# Patient Record
Sex: Female | Born: 1980 | Race: White | Hispanic: No | Marital: Married | Smoking: Never smoker
Health system: Southern US, Community
[De-identification: ages and names within clinical notes are randomized; demographics above are authoritative.]

## PROBLEM LIST (undated history)

## (undated) DIAGNOSIS — E039 Hypothyroidism, unspecified: Secondary | ICD-10-CM

## (undated) DIAGNOSIS — T7840XA Allergy, unspecified, initial encounter: Secondary | ICD-10-CM

## (undated) HISTORY — PX: HERNIA REPAIR: SHX51

## (undated) HISTORY — DX: Allergy, unspecified, initial encounter: T78.40XA

## (undated) HISTORY — PX: DIAPHRAGMATIC HERNIA REPAIR: SUR1167

---

## 2000-02-22 ENCOUNTER — Emergency Department (HOSPITAL_COMMUNITY): Admission: EM | Admit: 2000-02-22 | Discharge: 2000-02-22 | Payer: Self-pay | Admitting: *Deleted

## 2004-03-06 ENCOUNTER — Other Ambulatory Visit: Admission: RE | Admit: 2004-03-06 | Discharge: 2004-03-06 | Payer: Self-pay | Admitting: Obstetrics and Gynecology

## 2005-05-21 ENCOUNTER — Other Ambulatory Visit: Admission: RE | Admit: 2005-05-21 | Discharge: 2005-05-21 | Payer: Self-pay | Admitting: Obstetrics and Gynecology

## 2010-12-28 ENCOUNTER — Inpatient Hospital Stay (HOSPITAL_COMMUNITY): Admission: AD | Admit: 2010-12-28 | Payer: Self-pay | Source: Ambulatory Visit | Admitting: Dermatology

## 2011-03-09 ENCOUNTER — Inpatient Hospital Stay (HOSPITAL_COMMUNITY)
Admission: AD | Admit: 2011-03-09 | Discharge: 2011-03-11 | DRG: 373 | Disposition: A | Discharge status: Home / Self Care | Payer: BC Managed Care – PPO | Source: Ambulatory Visit | Attending: Obstetrics and Gynecology | Admitting: Obstetrics and Gynecology

## 2011-03-09 ENCOUNTER — Encounter (HOSPITAL_COMMUNITY): Payer: Self-pay | Admitting: *Deleted

## 2011-03-09 DIAGNOSIS — E079 Disorder of thyroid, unspecified: Principal | ICD-10-CM | POA: Diagnosis present

## 2011-03-09 DIAGNOSIS — D235 Other benign neoplasm of skin of trunk: Secondary | ICD-10-CM | POA: Diagnosis present

## 2011-03-09 DIAGNOSIS — E039 Hypothyroidism, unspecified: Secondary | ICD-10-CM | POA: Diagnosis present

## 2011-03-09 DIAGNOSIS — O99892 Other specified diseases and conditions complicating childbirth: Secondary | ICD-10-CM | POA: Diagnosis present

## 2011-03-09 HISTORY — DX: Hypothyroidism, unspecified: E03.9

## 2011-03-09 NOTE — Progress Notes (Signed)
Pt in for labor eval, reports ucs q4-5 minutes apart since 1800.  Denies any bleeding or leaking of fluid.  + FM.  Has been 4 cm in office, membranes stripped today.

## 2011-03-09 NOTE — Progress Notes (Signed)
PT SAYS HURT BAD SINCE  AT 730PM-  WENT TO DR TODAY- STRIPPED MEMBRANES.  . VE  - 4 CM - AND HAS BEEN X2 WEEKS.. DENIES HSV AND MRSA.

## 2011-03-10 ENCOUNTER — Other Ambulatory Visit: Payer: Self-pay | Admitting: Obstetrics and Gynecology

## 2011-03-10 LAB — CBC
HCT: 35.5 % — ABNORMAL LOW (ref 36.0–46.0)
Hemoglobin: 12 g/dL (ref 12.0–15.0)
MCH: 32.3 pg (ref 26.0–34.0)
MCHC: 33.8 g/dL (ref 30.0–36.0)
MCV: 95.4 fL (ref 78.0–100.0)
Platelets: 187 10*3/uL (ref 150–400)
RBC: 3.72 MIL/uL — ABNORMAL LOW (ref 3.87–5.11)
RDW: 13.1 % (ref 11.5–15.5)
WBC: 12.6 10*3/uL — ABNORMAL HIGH (ref 4.0–10.5)

## 2011-03-10 LAB — RPR: RPR Ser Ql: NONREACTIVE

## 2011-03-10 LAB — ABO/RH: ABO/RH(D): O POS

## 2011-03-10 MED ORDER — DIPHENHYDRAMINE HCL 25 MG PO CAPS: 25.0000 mg | ORAL_CAPSULE | Freq: Four times a day (QID) | ORAL | Status: DC | PRN

## 2011-03-10 MED ORDER — LACTATED RINGERS IV SOLN: 500.0000 mL | INTRAVENOUS | Status: DC | PRN

## 2011-03-10 MED ORDER — LEVOTHYROXINE SODIUM 75 MCG PO TABS
75.0000 ug | ORAL_TABLET | Freq: Every day | ORAL | Status: DC
  Administered 2011-03-10 – 2011-03-11 (×2): 75 ug via ORAL
  Filled 2011-03-10 (×3): qty 1

## 2011-03-10 MED ORDER — CITRIC ACID-SODIUM CITRATE 334-500 MG/5ML PO SOLN: 30.0000 mL | ORAL | Status: DC | PRN

## 2011-03-10 MED ORDER — BENZOCAINE-MENTHOL 20-0.5 % EX AERO
INHALATION_SPRAY | CUTANEOUS | Status: AC
  Administered 2011-03-10: 1 via TOPICAL
  Filled 2011-03-10: qty 56

## 2011-03-10 MED ORDER — ZOLPIDEM TARTRATE 5 MG PO TABS: 5.0000 mg | ORAL_TABLET | Freq: Every evening | ORAL | Status: DC | PRN

## 2011-03-10 MED ORDER — ACETAMINOPHEN 325 MG PO TABS: 650.0000 mg | ORAL_TABLET | ORAL | Status: DC | PRN

## 2011-03-10 MED ORDER — ONDANSETRON HCL 4 MG/2ML IJ SOLN: 4.0000 mg | INTRAMUSCULAR | Status: DC | PRN

## 2011-03-10 MED ORDER — SIMETHICONE 80 MG PO CHEW: 80.0000 mg | CHEWABLE_TABLET | ORAL | Status: DC | PRN

## 2011-03-10 MED ORDER — OXYTOCIN 20 UNITS IN LACTATED RINGERS INFUSION - SIMPLE
INTRAVENOUS | Status: AC
  Filled 2011-03-10: qty 1000

## 2011-03-10 MED ORDER — LIDOCAINE HCL (PF) 1 % IJ SOLN
INTRAMUSCULAR | Status: AC
  Filled 2011-03-10: qty 30

## 2011-03-10 MED ORDER — FLEET ENEMA 7-19 GM/118ML RE ENEM: 1.0000 | ENEMA | RECTAL | Status: DC | PRN

## 2011-03-10 MED ORDER — WITCH HAZEL-GLYCERIN EX PADS: 1.0000 "application " | MEDICATED_PAD | CUTANEOUS | Status: DC | PRN

## 2011-03-10 MED ORDER — LIDOCAINE HCL (PF) 1 % IJ SOLN
30.0000 mL | INTRAMUSCULAR | Status: DC | PRN
  Filled 2011-03-10: qty 30

## 2011-03-10 MED ORDER — BENZOCAINE-MENTHOL 20-0.5 % EX AERO
1.0000 "application " | INHALATION_SPRAY | CUTANEOUS | Status: DC | PRN
  Administered 2011-03-10 – 2011-03-11 (×2): 1 via TOPICAL

## 2011-03-10 MED ORDER — ERYTHROMYCIN 5 MG/GM OP OINT
TOPICAL_OINTMENT | OPHTHALMIC | Status: AC
  Filled 2011-03-10: qty 1

## 2011-03-10 MED ORDER — OXYTOCIN BOLUS FROM INFUSION
500.0000 mL | Freq: Once | INTRAVENOUS | Status: DC
  Filled 2011-03-10: qty 500

## 2011-03-10 MED ORDER — OXYCODONE-ACETAMINOPHEN 5-325 MG PO TABS
1.0000 | ORAL_TABLET | ORAL | Status: DC | PRN
  Administered 2011-03-11: 1 via ORAL
  Filled 2011-03-10: qty 1

## 2011-03-10 MED ORDER — SENNOSIDES-DOCUSATE SODIUM 8.6-50 MG PO TABS
2.0000 | ORAL_TABLET | Freq: Every day | ORAL | Status: DC
  Administered 2011-03-10: 2 via ORAL

## 2011-03-10 MED ORDER — IBUPROFEN 600 MG PO TABS
600.0000 mg | ORAL_TABLET | Freq: Four times a day (QID) | ORAL | Status: DC
Start: 2011-03-10 — End: 2011-03-11
  Administered 2011-03-10 – 2011-03-11 (×5): 600 mg via ORAL
  Filled 2011-03-10 (×5): qty 1

## 2011-03-10 MED ORDER — OXYCODONE-ACETAMINOPHEN 5-325 MG PO TABS: 2.0000 | ORAL_TABLET | ORAL | Status: DC | PRN

## 2011-03-10 MED ORDER — OXYTOCIN 10 UNIT/ML IJ SOLN
INTRAMUSCULAR | Status: AC
  Filled 2011-03-10: qty 1

## 2011-03-10 MED ORDER — IBUPROFEN 600 MG PO TABS: 600.0000 mg | ORAL_TABLET | Freq: Four times a day (QID) | ORAL | Status: DC | PRN

## 2011-03-10 MED ORDER — ONDANSETRON HCL 4 MG PO TABS: 4.0000 mg | ORAL_TABLET | ORAL | Status: DC | PRN

## 2011-03-10 MED ORDER — TETANUS-DIPHTH-ACELL PERTUSSIS 5-2.5-18.5 LF-MCG/0.5 IM SUSP
0.5000 mL | Freq: Once | INTRAMUSCULAR | Status: AC
  Administered 2011-03-11: 0.5 mL via INTRAMUSCULAR
  Filled 2011-03-10: qty 0.5

## 2011-03-10 MED ORDER — DIBUCAINE 1 % RE OINT: 1.0000 "application " | TOPICAL_OINTMENT | RECTAL | Status: DC | PRN

## 2011-03-10 MED ORDER — PRENATAL PLUS 27-1 MG PO TABS
1.0000 | ORAL_TABLET | Freq: Every day | ORAL | Status: DC
  Administered 2011-03-10 – 2011-03-11 (×2): 1 via ORAL
  Filled 2011-03-10 (×2): qty 1

## 2011-03-10 MED ORDER — LACTATED RINGERS IV SOLN: INTRAVENOUS | Status: DC

## 2011-03-10 MED ORDER — OXYTOCIN 20 UNITS IN LACTATED RINGERS INFUSION - SIMPLE: 125.0000 mL/h | Freq: Once | INTRAVENOUS | Status: DC

## 2011-03-10 MED ORDER — BISACODYL 10 MG RE SUPP: 10.0000 mg | Freq: Every day | RECTAL | Status: DC | PRN

## 2011-03-10 MED ORDER — LANOLIN HYDROUS EX OINT: TOPICAL_OINTMENT | CUTANEOUS | Status: DC | PRN

## 2011-03-10 MED ORDER — ONDANSETRON HCL 4 MG/2ML IJ SOLN: 4.0000 mg | Freq: Four times a day (QID) | INTRAMUSCULAR | Status: DC | PRN

## 2011-03-10 NOTE — H&P (Signed)
Stacy Peck is a 30 y.o. female presenting for C/O contractions.   Maternal Medical History:  Reason for admission: Reason for admission: contractions.  Contractions: Onset was 3-5 hours ago.   Frequency: irregular.    Fetal activity: Perceived fetal activity is normal.      OB History    Grav Para Term Preterm Abortions TAB SAB Ect Mult Living   1              Past Medical History  Diagnosis Date  . Hypothyroidism    Past Surgical History  Procedure Date  . Diaphragmatic hernia repair    Family History: family history is not on file. Social History:  reports that she has never smoked. She does not have any smokeless tobacco history on file. She reports that she does not drink alcohol or use illicit drugs.  Review of Systems  Eyes: Negative for blurred vision.  Neurological: Negative for headaches.    Dilation: 4.5 Effacement (%): 90 Station: -2 Exam by:: Cletis Media, RN Blood pressure 115/61, pulse 76, temperature 98.9 F (37.2 C), height 4\' 11"  (1.499 m), weight 54.205 kg (119 lb 8 oz). Maternal Exam:  Uterine Assessment: Contraction strength is firm.  Contraction frequency is regular.      Physical Exam  Constitutional: She appears well-developed and well-nourished.    Prenatal labs: ABO, Rh:   Antibody:   Rubella:   RPR:    HBsAg:    HIV:    GBS:     Assessment/Plan: 30 yo MWF G1P0 at 55 0/7weeks who C/O contractions   Nazir Hacker II,Rosibel Giacobbe E 03/10/2011, 12:40 AM

## 2011-03-10 NOTE — Progress Notes (Signed)
Post Partum Day 0 Subjective: no complaints and up ad lib  Objective: Blood pressure 110/69, pulse 75, temperature 98.8 F (37.1 C), temperature source Oral, resp. rate 20, height 4\' 11"  (1.499 m), weight 54.205 kg (119 lb 8 oz), SpO2 99.00%.  Physical Exam:  General: alert and cooperative Lochia: appropriate Uterine Fundus: firm Perineum intact DVT Evaluation: No evidence of DVT seen on physical exam.   Basename 03/10/11 0059  HGB 12.0  HCT 35.5*    Assessment/Plan: Plan for discharge tomorrow   LOS: 1 day   Koa Zoeller G 03/10/2011, 9:17 AM

## 2011-03-10 NOTE — Progress Notes (Signed)
Delivery Note Pt was reported to have cx dilated to 4 cm and was going to walk I was then called to MAU for FHT in 50s. I was already in hospital Upon my arrival to MAU pt was complete and pushing at +3  station with SROM for clear fluid. Had gone from 8 cm to delivery in approx 9 minutes Excellent progress with pushing to  SVD of VFI Apgars 8/9 Weight pending Placenta intact, 3 vessels Ebl 250cc Cx Vag intact Second degree ML lac repaired Pedunculated nevus removed from left buttock to path Pt stable in MAU Infant to nursery

## 2011-03-11 ENCOUNTER — Encounter (HOSPITAL_COMMUNITY): Payer: Self-pay | Admitting: *Deleted

## 2011-03-11 ENCOUNTER — Inpatient Hospital Stay (HOSPITAL_COMMUNITY): Admission: RE | Admit: 2011-03-11 | Payer: Self-pay | Source: Ambulatory Visit

## 2011-03-11 MED ORDER — IBUPROFEN 600 MG PO TABS: 600.0000 mg | ORAL_TABLET | Freq: Four times a day (QID) | ORAL | Status: AC

## 2011-03-11 MED ORDER — OXYCODONE-ACETAMINOPHEN 5-325 MG PO TABS: 1.0000 | ORAL_TABLET | ORAL | Status: AC | PRN

## 2011-03-11 MED ORDER — BENZOCAINE-MENTHOL 20-0.5 % EX AERO
INHALATION_SPRAY | CUTANEOUS | Status: AC
  Administered 2011-03-11: 1 via TOPICAL
  Filled 2011-03-11: qty 56

## 2011-03-11 NOTE — Discharge Summary (Signed)
Obstetric Discharge Summary Reason for Admission: onset of labor Prenatal Procedures: none Intrapartum Procedures: spontaneous vaginal delivery Postpartum Procedures: none Complications-Operative and Postpartum: none Hemoglobin  Date Value Range Status  03/10/2011 12.0  12.0-15.0 (g/dL) Final     HCT  Date Value Range Status  03/10/2011 35.5* 36.0-46.0 (%) Final    Discharge Diagnoses: Term Pregnancy-delivered  Discharge Information: Date: 03/11/2011 Activity: pelvic rest Diet: routine Medications: PNV, Ibuprofen and Percocet Condition: stable Instructions: refer to practice specific booklet Discharge to: home   Newborn Data: Live born female  Birth Weight: 6 lb 13.7 oz (3110 g) APGAR: 8, 9  Home with mother.  Stacy Peck G 03/11/2011, 9:20 AM

## 2011-03-11 NOTE — Progress Notes (Signed)
Post Partum Day 1 Subjective: no complaints, up ad lib, voiding and + flatus  Objective: Blood pressure 100/67, pulse 66, temperature 98.2 F (36.8 C), temperature source Oral, resp. rate 18, height 4\' 11"  (1.499 m), weight 54.205 kg (119 lb 8 oz), SpO2 99.00%.  Physical Exam:  General: alert and cooperative Lochia: appropriate Uterine Fundus: firm Incision: healing well DVT Evaluation: No evidence of DVT seen on physical exam.   Basename 03/10/11 0059  HGB 12.0  HCT 35.5*    Assessment/Plan: Discharge home   LOS: 2 days   Eddye Broxterman G 03/11/2011, 9:02 AM

## 2012-07-20 ENCOUNTER — Ambulatory Visit (INDEPENDENT_AMBULATORY_CARE_PROVIDER_SITE_OTHER): Payer: BC Managed Care – PPO | Admitting: Physician Assistant

## 2012-07-20 VITALS — BP 114/72 | HR 77 | Temp 98.5°F | Resp 16 | Ht 60.5 in | Wt 96.4 lb

## 2012-07-20 DIAGNOSIS — J069 Acute upper respiratory infection, unspecified: Secondary | ICD-10-CM

## 2012-07-20 MED ORDER — AMOXICILLIN-POT CLAVULANATE 875-125 MG PO TABS: 1.0000 | ORAL_TABLET | Freq: Two times a day (BID) | ORAL | Status: DC

## 2012-07-20 MED ORDER — GUAIFENESIN ER 1200 MG PO TB12: 1.0000 | ORAL_TABLET | Freq: Two times a day (BID) | ORAL | Status: DC | PRN

## 2012-07-20 MED ORDER — IPRATROPIUM BROMIDE 0.03 % NA SOLN: 2.0000 | Freq: Two times a day (BID) | NASAL | Status: DC

## 2012-07-20 NOTE — Patient Instructions (Addendum)
Continue using Sudafed as you have been for the next two days.  Atrovent nasal spray twice daily for relief of nasal congestion.  Also begin Mucinex twice daily.  An allergy medication (like Zyrtec or Claritin) may be helpful as well.  Plenty of fluids (water is best!)  I have also sent Augmentin (antibiotic) to the pharmacy.  If you are not improving, or if you are worsening in the next 2-3 days, begin the antibiotic.  If you start, be sure to finish the full course - twice daily for 10 days.  Take with food to reduce stomach upset.   Upper Respiratory Infection, Adult An upper respiratory infection (URI) is also sometimes known as the common cold. The upper respiratory tract includes the nose, sinuses, throat, trachea, and bronchi. Bronchi are the airways leading to the lungs. Most people improve within 1 week, but symptoms can last up to 2 weeks. A residual cough may last even longer.  CAUSES Many different viruses can infect the tissues lining the upper respiratory tract. The tissues become irritated and inflamed and often become very moist. Mucus production is also common. A cold is contagious. You can easily spread the virus to others by oral contact. This includes kissing, sharing a glass, coughing, or sneezing. Touching your mouth or nose and then touching a surface, which is then touched by another person, can also spread the virus. SYMPTOMS  Symptoms typically develop 1 to 3 days after you come in contact with a cold virus. Symptoms vary from person to person. They may include:  Runny nose.  Sneezing.  Nasal congestion.  Sinus irritation.  Sore throat.  Loss of voice (laryngitis).  Cough.  Fatigue.  Muscle aches.  Loss of appetite.  Headache.  Low-grade fever. DIAGNOSIS  You might diagnose your own cold based on familiar symptoms, since most people get a cold 2 to 3 times a year. Your caregiver can confirm this based on your exam. Most importantly, your caregiver can check  that your symptoms are not due to another disease such as strep throat, sinusitis, pneumonia, asthma, or epiglottitis. Blood tests, throat tests, and X-rays are not necessary to diagnose a common cold, but they may sometimes be helpful in excluding other more serious diseases. Your caregiver will decide if any further tests are required. RISKS AND COMPLICATIONS  You may be at risk for a more severe case of the common cold if you smoke cigarettes, have chronic heart disease (such as heart failure) or lung disease (such as asthma), or if you have a weakened immune system. The very young and very old are also at risk for more serious infections. Bacterial sinusitis, middle ear infections, and bacterial pneumonia can complicate the common cold. The common cold can worsen asthma and chronic obstructive pulmonary disease (COPD). Sometimes, these complications can require emergency medical care and may be life-threatening. PREVENTION  The best way to protect against getting a cold is to practice good hygiene. Avoid oral or hand contact with people with cold symptoms. Wash your hands often if contact occurs. There is no clear evidence that vitamin C, vitamin E, echinacea, or exercise reduces the chance of developing a cold. However, it is always recommended to get plenty of rest and practice good nutrition. TREATMENT  Treatment is directed at relieving symptoms. There is no cure. Antibiotics are not effective, because the infection is caused by a virus, not by bacteria. Treatment may include:  Increased fluid intake. Sports drinks offer valuable electrolytes, sugars, and fluids.  Breathing heated mist or steam (vaporizer or shower).  Eating chicken soup or other clear broths, and maintaining good nutrition.  Getting plenty of rest.  Using gargles or lozenges for comfort.  Controlling fevers with ibuprofen or acetaminophen as directed by your caregiver.  Increasing usage of your inhaler if you have  asthma. Zinc gel and zinc lozenges, taken in the first 24 hours of the common cold, can shorten the duration and lessen the severity of symptoms. Pain medicines may help with fever, muscle aches, and throat pain. A variety of non-prescription medicines are available to treat congestion and runny nose. Your caregiver can make recommendations and may suggest nasal or lung inhalers for other symptoms.  HOME CARE INSTRUCTIONS   Only take over-the-counter or prescription medicines for pain, discomfort, or fever as directed by your caregiver.  Use a warm mist humidifier or inhale steam from a shower to increase air moisture. This may keep secretions moist and make it easier to breathe.  Drink enough water and fluids to keep your urine clear or pale yellow.  Rest as needed.  Return to work when your temperature has returned to normal or as your caregiver advises. You may need to stay home longer to avoid infecting others. You can also use a face mask and careful hand washing to prevent spread of the virus. SEEK MEDICAL CARE IF:   After the first few days, you feel you are getting worse rather than better.  You need your caregiver's advice about medicines to control symptoms.  You develop chills, worsening shortness of breath, or brown or red sputum. These may be signs of pneumonia.  You develop yellow or brown nasal discharge or pain in the face, especially when you bend forward. These may be signs of sinusitis.  You develop a fever, swollen neck glands, pain with swallowing, or white areas in the back of your throat. These may be signs of strep throat. SEEK IMMEDIATE MEDICAL CARE IF:   You have a fever.  You develop severe or persistent headache, ear pain, sinus pain, or chest pain.  You develop wheezing, a prolonged cough, cough up blood, or have a change in your usual mucus (if you have chronic lung disease).  You develop sore muscles or a stiff neck. Document Released: 11/10/2000  Document Revised: 08/09/2011 Document Reviewed: 09/18/2010 [facility] Patient Information 2013 [place], [place].

## 2012-07-20 NOTE — Progress Notes (Signed)
  Subjective:    Patient ID: Stacy Peck, female    DOB: 1981/05/03, 32 y.o.   MRN: [id number]  HPI   Stacy Peck is a pleasant 32 yr old female here because "I thin I have a sinus infection."  Has felt stuffy for 6 days now.  Was initially sort of achy, weak, feverish - though no documented fevers.  Thought she was getting better, then 3 days ago felt worse.  Still feels very congested.  States she is not prone to sinus infections.  No facial pain or toot pain.  No ear pain.  Some sore throat last week, but that has resolved.  No coughing, SOB, or wheezing.  No GI symptoms.   1 yr old daughter has been sick with a cold and ear infection.  Review of Systems  Constitutional: Negative for fever and chills.  HENT: Positive for congestion, rhinorrhea and sinus pressure. Negative for ear pain, sore throat and postnasal drip.   Respiratory: Negative for cough, shortness of breath and wheezing.   Cardiovascular: Negative.   Gastrointestinal: Negative.   Musculoskeletal: Negative.   Skin: Negative.   Neurological: Negative.        Objective:   Physical Exam  Vitals reviewed. Constitutional: She is oriented to person, place, and time. She appears well-developed and well-nourished. No distress.  HENT:  Head: Normocephalic and atraumatic.  Right Ear: Tympanic membrane and ear canal normal.  Left Ear: Tympanic membrane and ear canal normal.  Nose: Mucosal edema present. No rhinorrhea. Right sinus exhibits no maxillary sinus tenderness and no frontal sinus tenderness. Left sinus exhibits no maxillary sinus tenderness and no frontal sinus tenderness.  Mouth/Throat: Uvula is midline, oropharynx is clear and moist and mucous membranes are normal.  Pt sounds very congested  Eyes: Conjunctivae are normal. No scleral icterus.  Neck: Neck supple.  Cardiovascular: Normal rate, regular rhythm and normal heart sounds.  Exam reveals no gallop and no friction rub.   No murmur heard. Pulmonary/Chest: Effort  normal and breath sounds normal. She has no wheezes. She has no rales.  Lymphadenopathy:    She has no cervical adenopathy.  Neurological: She is alert and oriented to person, place, and time.  Skin: Skin is warm and dry.  Psychiatric: She has a normal mood and affect. Her behavior is normal.     Filed Vitals:   07/20/12 1724  BP: 114/72  Pulse: 77  Temp: 98.5 F (36.9 C)  Resp: 16        Assessment & Plan:  URI (upper respiratory infection) - Plan: ipratropium (ATROVENT) 0.03 % nasal spray, Guaifenesin (MUCINEX MAXIMUM STRENGTH) 1200 MG TB12, amoxicillin-clavulanate (AUGMENTIN) 875-125 MG per tablet   Stacy Peck is a pleasant 32 yr old female with upper resp infection.  Suspect viral etiology at this point as pt is afebrile, lungs CTA.  Sinuses are non-tender.  She does not endorse facial pain or tooth pain.  Symptoms have been present for only 6 days, so I am hesitant to call this bacterial sinusitis at this point.  Will treat symptoms with Mucinex, Atrovent, and Sudafed.  She may also add an allergy medication to this.  Encourage plenty of fluids and rest.  I have sent amox/clav to the pharmacy for her to begin in 2-3 days if no improvement.  Discussed with pt that if she starts the abx, she needs to finish the full course.  Pt understands and is in agreement with this plan.

## 2013-04-12 LAB — OB RESULTS CONSOLE GC/CHLAMYDIA
Chlamydia: NEGATIVE
Gonorrhea: NEGATIVE

## 2013-04-12 LAB — OB RESULTS CONSOLE RPR: RPR: NONREACTIVE

## 2013-04-12 LAB — OB RESULTS CONSOLE HEPATITIS B SURFACE ANTIGEN: Hepatitis B Surface Ag: NEGATIVE

## 2013-04-12 LAB — OB RESULTS CONSOLE HIV ANTIBODY (ROUTINE TESTING): HIV: NONREACTIVE

## 2013-04-12 LAB — OB RESULTS CONSOLE RUBELLA ANTIBODY, IGM: Rubella: NON-IMMUNE/NOT IMMUNE

## 2013-04-12 LAB — OB RESULTS CONSOLE ANTIBODY SCREEN: Antibody Screen: NEGATIVE

## 2013-10-24 LAB — OB RESULTS CONSOLE GBS: GBS: POSITIVE

## 2013-11-24 ENCOUNTER — Encounter (HOSPITAL_COMMUNITY): Payer: BC Managed Care – PPO | Admitting: Anesthesiology

## 2013-11-24 ENCOUNTER — Encounter (HOSPITAL_COMMUNITY)
Admission: AD | Disposition: A | Discharge status: Home / Self Care | Payer: Self-pay | Source: Ambulatory Visit | Attending: Obstetrics & Gynecology

## 2013-11-24 ENCOUNTER — Inpatient Hospital Stay (HOSPITAL_COMMUNITY): Payer: BC Managed Care – PPO

## 2013-11-24 ENCOUNTER — Inpatient Hospital Stay (HOSPITAL_COMMUNITY)
Admission: AD | Admit: 2013-11-24 | Discharge: 2013-11-27 | DRG: 765 | Disposition: A | Discharge status: Home / Self Care | Payer: BC Managed Care – PPO | Source: Ambulatory Visit | Attending: Obstetrics & Gynecology | Admitting: Obstetrics & Gynecology

## 2013-11-24 ENCOUNTER — Encounter (HOSPITAL_COMMUNITY): Payer: Self-pay | Admitting: *Deleted

## 2013-11-24 ENCOUNTER — Inpatient Hospital Stay (HOSPITAL_COMMUNITY): Payer: BC Managed Care – PPO | Admitting: Anesthesiology

## 2013-11-24 DIAGNOSIS — D62 Acute posthemorrhagic anemia: Secondary | ICD-10-CM | POA: Diagnosis present

## 2013-11-24 DIAGNOSIS — O9903 Anemia complicating the puerperium: Secondary | ICD-10-CM | POA: Diagnosis present

## 2013-11-24 DIAGNOSIS — E039 Hypothyroidism, unspecified: Secondary | ICD-10-CM | POA: Diagnosis present

## 2013-11-24 DIAGNOSIS — O2390 Unspecified genitourinary tract infection in pregnancy, unspecified trimester: Secondary | ICD-10-CM | POA: Diagnosis present

## 2013-11-24 DIAGNOSIS — Z22330 Carrier of Group B streptococcus: Secondary | ICD-10-CM

## 2013-11-24 DIAGNOSIS — Z98891 History of uterine scar from previous surgery: Secondary | ICD-10-CM

## 2013-11-24 DIAGNOSIS — E079 Disorder of thyroid, unspecified: Secondary | ICD-10-CM | POA: Diagnosis present

## 2013-11-24 DIAGNOSIS — O9989 Other specified diseases and conditions complicating pregnancy, childbirth and the puerperium: Secondary | ICD-10-CM

## 2013-11-24 DIAGNOSIS — O99892 Other specified diseases and conditions complicating childbirth: Secondary | ICD-10-CM | POA: Diagnosis present

## 2013-11-24 DIAGNOSIS — R209 Unspecified disturbances of skin sensation: Secondary | ICD-10-CM | POA: Diagnosis not present

## 2013-11-24 DIAGNOSIS — N12 Tubulo-interstitial nephritis, not specified as acute or chronic: Secondary | ICD-10-CM | POA: Diagnosis present

## 2013-11-24 DIAGNOSIS — O99284 Endocrine, nutritional and metabolic diseases complicating childbirth: Secondary | ICD-10-CM

## 2013-11-24 DIAGNOSIS — O411090 Infection of amniotic sac and membranes, unspecified, unspecified trimester, not applicable or unspecified: Secondary | ICD-10-CM | POA: Diagnosis present

## 2013-11-24 LAB — COMPREHENSIVE METABOLIC PANEL
ALT: 9 U/L (ref 0–35)
AST: 19 U/L (ref 0–37)
Albumin: 3.5 g/dL (ref 3.5–5.2)
Alkaline Phosphatase: 280 U/L — ABNORMAL HIGH (ref 39–117)
BUN: 8 mg/dL (ref 6–23)
CO2: 22 mEq/L (ref 19–32)
Calcium: 9.8 mg/dL (ref 8.4–10.5)
Chloride: 95 mEq/L — ABNORMAL LOW (ref 96–112)
Creatinine, Ser: 0.76 mg/dL (ref 0.50–1.10)
GFR calc Af Amer: >90 mL/min (ref >90)
GFR calc non Af Amer: >90 mL/min (ref >90)
Glucose, Bld: 93 mg/dL (ref 70–99)
Potassium: 3.9 mEq/L (ref 3.7–5.3)
Sodium: 134 mEq/L — ABNORMAL LOW (ref 137–147)
Total Bilirubin: 1.2 mg/dL (ref 0.3–1.2)
Total Protein: 7.9 g/dL (ref 6.0–8.3)

## 2013-11-24 LAB — CBC WITH DIFFERENTIAL/PLATELET
Basophils Absolute: 0 10*3/uL (ref 0.0–0.1)
Basophils Relative: 0 % (ref 0–1)
Eosinophils Absolute: 0 10*3/uL (ref 0.0–0.7)
Eosinophils Relative: 0 % (ref 0–5)
HCT: 36.6 % (ref 36.0–46.0)
Hemoglobin: 12.6 g/dL (ref 12.0–15.0)
Lymphocytes Relative: 6 % — ABNORMAL LOW (ref 12–46)
Lymphs Abs: 1.1 10*3/uL (ref 0.7–4.0)
MCH: 33.2 pg (ref 26.0–34.0)
MCHC: 34.4 g/dL (ref 30.0–36.0)
MCV: 96.6 fL (ref 78.0–100.0)
Monocytes Absolute: 1.5 10*3/uL — ABNORMAL HIGH (ref 0.1–1.0)
Monocytes Relative: 8 % (ref 3–12)
Neutro Abs: 16.8 10*3/uL — ABNORMAL HIGH (ref 1.7–7.7)
Neutrophils Relative %: 86 % — ABNORMAL HIGH (ref 43–77)
Platelets: 237 10*3/uL (ref 150–400)
RBC: 3.79 MIL/uL — ABNORMAL LOW (ref 3.87–5.11)
RDW: 13.5 % (ref 11.5–15.5)
WBC: 19.5 10*3/uL — ABNORMAL HIGH (ref 4.0–10.5)

## 2013-11-24 LAB — URINALYSIS, ROUTINE W REFLEX MICROSCOPIC
Glucose, UA: NEGATIVE mg/dL
Hgb urine dipstick: NEGATIVE
Ketones, ur: 40 mg/dL — AB
Nitrite: POSITIVE — AB
Protein, ur: 30 mg/dL — AB
Specific Gravity, Urine: 1.02 (ref 1.005–1.030)
Urobilinogen, UA: 0.2 mg/dL (ref 0.0–1.0)
pH: 6 (ref 5.0–8.0)

## 2013-11-24 LAB — URINE MICROSCOPIC-ADD ON

## 2013-11-24 SURGERY — Surgical Case
Anesthesia: Spinal | Site: Abdomen

## 2013-11-24 MED ORDER — KETOROLAC TROMETHAMINE 30 MG/ML IJ SOLN: 15.0000 mg | Freq: Once | INTRAMUSCULAR | Status: DC | PRN

## 2013-11-24 MED ORDER — ACETAMINOPHEN 500 MG PO TABS
ORAL_TABLET | ORAL | Status: AC
  Administered 2013-11-24: 1000 mg via ORAL
  Filled 2013-11-24: qty 1

## 2013-11-24 MED ORDER — IBUPROFEN 600 MG PO TABS
600.0000 mg | ORAL_TABLET | Freq: Four times a day (QID) | ORAL | Status: DC
  Administered 2013-11-24 – 2013-11-27 (×10): 600 mg via ORAL
  Filled 2013-11-24 (×10): qty 1

## 2013-11-24 MED ORDER — PHENYLEPHRINE HCL 10 MG/ML IJ SOLN
INTRAMUSCULAR | Status: DC | PRN
  Administered 2013-11-24 (×3): 80 ug via INTRAVENOUS
  Administered 2013-11-24: 40 ug via INTRAVENOUS

## 2013-11-24 MED ORDER — SIMETHICONE 80 MG PO CHEW
80.0000 mg | CHEWABLE_TABLET | Freq: Three times a day (TID) | ORAL | Status: DC
  Administered 2013-11-25 – 2013-11-27 (×7): 80 mg via ORAL
  Filled 2013-11-24 (×6): qty 1

## 2013-11-24 MED ORDER — PHENYLEPHRINE 8 MG IN D5W 100 ML (0.08MG/ML) PREMIX OPTIME
INJECTION | INTRAVENOUS | Status: DC | PRN
  Administered 2013-11-24: 60 ug/min via INTRAVENOUS

## 2013-11-24 MED ORDER — ACETAMINOPHEN 500 MG PO TABS
1000.0000 mg | ORAL_TABLET | Freq: Once | ORAL | Status: AC
  Administered 2013-11-24: 1000 mg via ORAL

## 2013-11-24 MED ORDER — METOCLOPRAMIDE HCL 5 MG/ML IJ SOLN
INTRAMUSCULAR | Status: AC
  Filled 2013-11-24: qty 2

## 2013-11-24 MED ORDER — NALBUPHINE HCL 10 MG/ML IJ SOLN
5.0000 mg | INTRAMUSCULAR | Status: DC | PRN
  Administered 2013-11-24: 10 mg via INTRAVENOUS
  Filled 2013-11-24: qty 1

## 2013-11-24 MED ORDER — MENTHOL 3 MG MT LOZG: 1.0000 | LOZENGE | OROMUCOSAL | Status: DC | PRN

## 2013-11-24 MED ORDER — LACTATED RINGERS IV SOLN
INTRAVENOUS | Status: DC | PRN
  Administered 2013-11-24: 12:00:00 via INTRAVENOUS

## 2013-11-24 MED ORDER — MEPERIDINE HCL 25 MG/ML IJ SOLN: 6.2500 mg | INTRAMUSCULAR | Status: DC | PRN

## 2013-11-24 MED ORDER — ONDANSETRON HCL 4 MG/2ML IJ SOLN
INTRAMUSCULAR | Status: DC | PRN
  Administered 2013-11-24: 4 mg via INTRAVENOUS

## 2013-11-24 MED ORDER — HYDROMORPHONE HCL PF 1 MG/ML IJ SOLN
INTRAMUSCULAR | Status: AC
  Filled 2013-11-24: qty 1

## 2013-11-24 MED ORDER — FENTANYL CITRATE 0.05 MG/ML IJ SOLN
INTRAMUSCULAR | Status: AC
  Filled 2013-11-24: qty 2

## 2013-11-24 MED ORDER — LACTATED RINGERS IV SOLN
INTRAVENOUS | Status: DC | PRN
  Administered 2013-11-24 (×3): via INTRAVENOUS

## 2013-11-24 MED ORDER — KETOROLAC TROMETHAMINE 30 MG/ML IJ SOLN
30.0000 mg | Freq: Four times a day (QID) | INTRAMUSCULAR | Status: AC | PRN
  Administered 2013-11-24: 30 mg via INTRAMUSCULAR

## 2013-11-24 MED ORDER — PHENYLEPHRINE HCL 10 MG/ML IJ SOLN
INTRAMUSCULAR | Status: AC
  Filled 2013-11-24: qty 1

## 2013-11-24 MED ORDER — MORPHINE SULFATE (PF) 0.5 MG/ML IJ SOLN
INTRAMUSCULAR | Status: DC | PRN
  Administered 2013-11-24: .2 mg via INTRATHECAL

## 2013-11-24 MED ORDER — LEVOTHYROXINE SODIUM 75 MCG PO TABS
75.0000 ug | ORAL_TABLET | Freq: Every day | ORAL | Status: DC
  Administered 2013-11-25 – 2013-11-27 (×3): 75 ug via ORAL
  Filled 2013-11-24 (×4): qty 1

## 2013-11-24 MED ORDER — WITCH HAZEL-GLYCERIN EX PADS: 1.0000 "application " | MEDICATED_PAD | CUTANEOUS | Status: DC | PRN

## 2013-11-24 MED ORDER — ONDANSETRON HCL 4 MG/2ML IJ SOLN: 4.0000 mg | INTRAMUSCULAR | Status: DC | PRN

## 2013-11-24 MED ORDER — LACTATED RINGERS IV BOLUS (SEPSIS)
1000.0000 mL | Freq: Once | INTRAVENOUS | Status: AC
  Administered 2013-11-24: 1000 mL via INTRAVENOUS

## 2013-11-24 MED ORDER — DIBUCAINE 1 % RE OINT: 1.0000 "application " | TOPICAL_OINTMENT | RECTAL | Status: DC | PRN

## 2013-11-24 MED ORDER — IBUPROFEN 600 MG PO TABS: 600.0000 mg | ORAL_TABLET | Freq: Four times a day (QID) | ORAL | Status: DC | PRN

## 2013-11-24 MED ORDER — FENTANYL CITRATE 0.05 MG/ML IJ SOLN
INTRAMUSCULAR | Status: DC | PRN
  Administered 2013-11-24: 12.5 ug via INTRATHECAL

## 2013-11-24 MED ORDER — ONDANSETRON HCL 4 MG PO TABS: 4.0000 mg | ORAL_TABLET | ORAL | Status: DC | PRN

## 2013-11-24 MED ORDER — NALOXONE HCL 1 MG/ML IJ SOLN
1.0000 ug/kg/h | INTRAVENOUS | Status: DC | PRN
  Filled 2013-11-24: qty 2

## 2013-11-24 MED ORDER — DIPHENHYDRAMINE HCL 25 MG PO CAPS
25.0000 mg | ORAL_CAPSULE | ORAL | Status: DC | PRN
  Administered 2013-11-25: 25 mg via ORAL
  Filled 2013-11-24: qty 1

## 2013-11-24 MED ORDER — SCOPOLAMINE 1 MG/3DAYS TD PT72
MEDICATED_PATCH | TRANSDERMAL | Status: AC
  Filled 2013-11-24: qty 1

## 2013-11-24 MED ORDER — DEXTROSE IN LACTATED RINGERS 5 % IV SOLN: INTRAVENOUS | Status: DC

## 2013-11-24 MED ORDER — TETANUS-DIPHTH-ACELL PERTUSSIS 5-2.5-18.5 LF-MCG/0.5 IM SUSP: 0.5000 mL | Freq: Once | INTRAMUSCULAR | Status: DC

## 2013-11-24 MED ORDER — NALOXONE HCL 0.4 MG/ML IJ SOLN: 0.4000 mg | INTRAMUSCULAR | Status: DC | PRN

## 2013-11-24 MED ORDER — PRENATAL MULTIVITAMIN CH
1.0000 | ORAL_TABLET | Freq: Every day | ORAL | Status: DC
Start: 2013-11-25 — End: 2013-11-27
  Administered 2013-11-25 – 2013-11-26 (×2): 1 via ORAL
  Filled 2013-11-24: qty 1

## 2013-11-24 MED ORDER — LACTATED RINGERS IV SOLN
INTRAVENOUS | Status: DC
  Administered 2013-11-24: 22:00:00 via INTRAVENOUS

## 2013-11-24 MED ORDER — OXYTOCIN 40 UNITS IN LACTATED RINGERS INFUSION - SIMPLE MED: 62.5000 mL/h | INTRAVENOUS | Status: AC

## 2013-11-24 MED ORDER — NALBUPHINE HCL 10 MG/ML IJ SOLN: 5.0000 mg | INTRAMUSCULAR | Status: DC | PRN

## 2013-11-24 MED ORDER — SIMETHICONE 80 MG PO CHEW
80.0000 mg | CHEWABLE_TABLET | ORAL | Status: DC
  Administered 2013-11-24 – 2013-11-27 (×3): 80 mg via ORAL
  Filled 2013-11-24 (×3): qty 1

## 2013-11-24 MED ORDER — METOCLOPRAMIDE HCL 5 MG/ML IJ SOLN
INTRAMUSCULAR | Status: DC | PRN
  Administered 2013-11-24: 10 mg via INTRAVENOUS

## 2013-11-24 MED ORDER — MORPHINE SULFATE 0.5 MG/ML IJ SOLN
INTRAMUSCULAR | Status: AC
  Filled 2013-11-24: qty 10

## 2013-11-24 MED ORDER — OXYTOCIN 40 UNITS IN LACTATED RINGERS INFUSION - SIMPLE MED
INTRAVENOUS | Status: DC | PRN
  Administered 2013-11-24: 40 [IU] via INTRAVENOUS

## 2013-11-24 MED ORDER — PHENYLEPHRINE 40 MCG/ML (10ML) SYRINGE FOR IV PUSH (FOR BLOOD PRESSURE SUPPORT)
PREFILLED_SYRINGE | INTRAVENOUS | Status: AC
  Filled 2013-11-24: qty 5

## 2013-11-24 MED ORDER — METOCLOPRAMIDE HCL 5 MG/ML IJ SOLN: 10.0000 mg | Freq: Three times a day (TID) | INTRAMUSCULAR | Status: DC | PRN

## 2013-11-24 MED ORDER — KETOROLAC TROMETHAMINE 30 MG/ML IJ SOLN
INTRAMUSCULAR | Status: AC
  Administered 2013-11-24: 30 mg via INTRAMUSCULAR
  Filled 2013-11-24: qty 1

## 2013-11-24 MED ORDER — ONDANSETRON HCL 4 MG/2ML IJ SOLN: 4.0000 mg | Freq: Three times a day (TID) | INTRAMUSCULAR | Status: DC | PRN

## 2013-11-24 MED ORDER — SIMETHICONE 80 MG PO CHEW: 80.0000 mg | CHEWABLE_TABLET | ORAL | Status: DC | PRN

## 2013-11-24 MED ORDER — OXYCODONE-ACETAMINOPHEN 5-325 MG PO TABS
1.0000 | ORAL_TABLET | ORAL | Status: DC | PRN
  Administered 2013-11-24 – 2013-11-25 (×2): 1 via ORAL
  Administered 2013-11-25: 2 via ORAL
  Administered 2013-11-25: 1 via ORAL
  Administered 2013-11-25: 2 via ORAL
  Administered 2013-11-26 (×2): 1 via ORAL
  Administered 2013-11-26 – 2013-11-27 (×2): 2 via ORAL
  Filled 2013-11-24: qty 2
  Filled 2013-11-24 (×4): qty 1
  Filled 2013-11-24 (×3): qty 2

## 2013-11-24 MED ORDER — SENNOSIDES-DOCUSATE SODIUM 8.6-50 MG PO TABS
2.0000 | ORAL_TABLET | ORAL | Status: DC
  Administered 2013-11-24 – 2013-11-27 (×3): 2 via ORAL
  Filled 2013-11-24 (×3): qty 2

## 2013-11-24 MED ORDER — DIPHENHYDRAMINE HCL 50 MG/ML IJ SOLN: 25.0000 mg | INTRAMUSCULAR | Status: DC | PRN

## 2013-11-24 MED ORDER — PROMETHAZINE HCL 25 MG/ML IJ SOLN: 6.2500 mg | INTRAMUSCULAR | Status: DC | PRN

## 2013-11-24 MED ORDER — HYDROMORPHONE HCL PF 1 MG/ML IJ SOLN
INTRAMUSCULAR | Status: AC
  Administered 2013-11-24: 0.5 mg via INTRAVENOUS
  Filled 2013-11-24: qty 1

## 2013-11-24 MED ORDER — DIPHENHYDRAMINE HCL 25 MG PO CAPS: 25.0000 mg | ORAL_CAPSULE | Freq: Four times a day (QID) | ORAL | Status: DC | PRN

## 2013-11-24 MED ORDER — ONDANSETRON HCL 4 MG/2ML IJ SOLN
INTRAMUSCULAR | Status: AC
  Filled 2013-11-24: qty 2

## 2013-11-24 MED ORDER — BUPIVACAINE IN DEXTROSE 0.75-8.25 % IT SOLN
INTRATHECAL | Status: DC | PRN
  Administered 2013-11-24: 1.2 mL via INTRATHECAL

## 2013-11-24 MED ORDER — LANOLIN HYDROUS EX OINT: 1.0000 "application " | TOPICAL_OINTMENT | CUTANEOUS | Status: DC | PRN

## 2013-11-24 MED ORDER — DIPHENHYDRAMINE HCL 50 MG/ML IJ SOLN: 12.5000 mg | INTRAMUSCULAR | Status: DC | PRN

## 2013-11-24 MED ORDER — OXYTOCIN 10 UNIT/ML IJ SOLN
INTRAMUSCULAR | Status: AC
  Filled 2013-11-24: qty 4

## 2013-11-24 MED ORDER — KETOROLAC TROMETHAMINE 30 MG/ML IJ SOLN: 30.0000 mg | Freq: Four times a day (QID) | INTRAMUSCULAR | Status: AC | PRN

## 2013-11-24 MED ORDER — ZOLPIDEM TARTRATE 5 MG PO TABS: 5.0000 mg | ORAL_TABLET | Freq: Every evening | ORAL | Status: DC | PRN

## 2013-11-24 MED ORDER — HYDROMORPHONE HCL PF 1 MG/ML IJ SOLN
0.2500 mg | INTRAMUSCULAR | Status: DC | PRN
  Administered 2013-11-24 (×3): 0.5 mg via INTRAVENOUS

## 2013-11-24 MED ORDER — SCOPOLAMINE 1 MG/3DAYS TD PT72
1.0000 | MEDICATED_PATCH | Freq: Once | TRANSDERMAL | Status: AC
  Administered 2013-11-24: 1.5 mg via TRANSDERMAL

## 2013-11-24 MED ORDER — SODIUM CHLORIDE 0.9 % IJ SOLN: 3.0000 mL | INTRAMUSCULAR | Status: DC | PRN

## 2013-11-24 MED ORDER — DEXTROSE 5 % IV SOLN
1.0000 g | INTRAVENOUS | Status: DC
  Administered 2013-11-24: 1 g via INTRAVENOUS
  Filled 2013-11-24 (×2): qty 10

## 2013-11-24 SURGICAL SUPPLY — 32 items
ADH SKN CLS APL DERMABOND .7 (GAUZE/BANDAGES/DRESSINGS)
CLAMP CORD UMBIL (MISCELLANEOUS) IMPLANT
CLOTH BEACON ORANGE TIMEOUT ST (SAFETY) ×2 IMPLANT
DERMABOND ADVANCED (GAUZE/BANDAGES/DRESSINGS)
DERMABOND ADVANCED .7 DNX12 (GAUZE/BANDAGES/DRESSINGS) IMPLANT
DRAPE LG THREE QUARTER DISP (DRAPES) IMPLANT
DRSG OPSITE POSTOP 4X10 (GAUZE/BANDAGES/DRESSINGS) ×2 IMPLANT
DURAPREP 26ML APPLICATOR (WOUND CARE) ×2 IMPLANT
ELECT REM PT RETURN 9FT ADLT (ELECTROSURGICAL) ×2
ELECTRODE REM PT RTRN 9FT ADLT (ELECTROSURGICAL) ×1 IMPLANT
EXTRACTOR VACUUM KIWI (MISCELLANEOUS) IMPLANT
EXTRACTOR VACUUM M CUP 4 TUBE (SUCTIONS) IMPLANT
GLOVE BIO SURGEON STRL SZ 6 (GLOVE) ×2 IMPLANT
GLOVE BIOGEL PI IND STRL 6 (GLOVE) ×2 IMPLANT
GLOVE BIOGEL PI INDICATOR 6 (GLOVE) ×2
GOWN STRL REUS W/TWL LRG LVL3 (GOWN DISPOSABLE) ×4 IMPLANT
KIT ABG SYR 3ML LUER SLIP (SYRINGE) ×2 IMPLANT
NDL HYPO 25X5/8 SAFETYGLIDE (NEEDLE) ×1 IMPLANT
NEEDLE HYPO 25X5/8 SAFETYGLIDE (NEEDLE) ×2 IMPLANT
NS IRRIG 1000ML POUR BTL (IV SOLUTION) ×2 IMPLANT
PACK C SECTION WH (CUSTOM PROCEDURE TRAY) ×2 IMPLANT
PAD OB MATERNITY 4.3X12.25 (PERSONAL CARE ITEMS) ×2 IMPLANT
STAPLER VISISTAT 35W (STAPLE) IMPLANT
SUT CHROMIC 0 CTX 36 (SUTURE) ×6 IMPLANT
SUT MON AB 2-0 CT1 27 (SUTURE) ×2 IMPLANT
SUT PDS AB 0 CT1 27 (SUTURE) IMPLANT
SUT PLAIN 0 NONE (SUTURE) IMPLANT
SUT VIC AB 0 CT1 36 (SUTURE) IMPLANT
SUT VIC AB 4-0 KS 27 (SUTURE) IMPLANT
TOWEL OR 17X24 6PK STRL BLUE (TOWEL DISPOSABLE) ×2 IMPLANT
TRAY FOLEY CATH 14FR (SET/KITS/TRAYS/PACK) IMPLANT
WATER STERILE IRR 1000ML POUR (IV SOLUTION) ×2 IMPLANT

## 2013-11-24 NOTE — Progress Notes (Signed)
Notified pt has 22 gauge catheter inserted at present, blood tube hemolyzed for type and screen, CRNA coming to attempt additional iv access. No new orders

## 2013-11-24 NOTE — Anesthesia Procedure Notes (Signed)
Spinal  Patient location during procedure: OR Start time: 11/24/2013 11:16 AM End time: 11/24/2013 11:18 AM Staffing Anesthesiologist: Lyn Hollingshead Performed by: anesthesiologist  Preanesthetic Checklist Completed: patient identified, surgical consent, pre-op evaluation, timeout performed, IV checked, risks and benefits discussed and monitors and equipment checked Spinal Block Patient position: sitting Prep: DuraPrep Patient monitoring: heart rate, cardiac monitor, continuous pulse ox and blood pressure Approach: midline Location: L3-4 Injection technique: single-shot Needle Needle type: Pencan  Needle gauge: 24 G Needle length: 9 cm Needle insertion depth: 4 cm Assessment Sensory level: T4

## 2013-11-24 NOTE — Op Note (Signed)
Stacy Peck PROCEDURE DATE: 11/24/2013  PREOPERATIVE DIAGNOSIS: Intrauterine pregnancy at  [redacted]w[redacted]d weeks gestation, maternal pyelonephritis, BPP 2/10  POSTOPERATIVE DIAGNOSIS: The same  PROCEDURE: Primary Low Transverse Cesarean Section  SURGEON:  Dr. Linda Hedges  INDICATIONS: Stacy Peck is a 33 y.o. G2P1001 at [redacted]w[redacted]d scheduled for cesarean section secondary to non-reassuring fetal status.  The patient presented to MAU with fever and flank pain and was diagnosed with pyelonephritis.  Repetitive decelerations improved with IVF but prompted BPP which returned at 2/10.  The patient was informed of the need to proceed with C/S for non-reassuring fetal status.  The risks of cesarean section discussed with the patient included but were not limited to: bleeding which may require transfusion or reoperation; infection which may require antibiotics; injury to bowel, bladder, ureters or other surrounding organs; injury to the fetus; need for additional procedures including hysterectomy in the event of a life-threatening hemorrhage; placental abnormalities wth subsequent pregnancies, incisional problems, thromboembolic phenomenon and other postoperative/anesthesia complications. The patient concurred with the proposed plan, giving informed written consent for the procedure.    FINDINGS:  Viable female infant in cephalic presentation, APGARs 8,9:  Weight pending.  Meconium stained amniotic fluid.  Intact placenta, three vessel cord.  Grossly normal uterus, ovaries and fallopian tubes.  Normal appearing appendix. .   ANESTHESIA:    Spinal  ESTIMATED BLOOD LOSS: 800 ml SPECIMENS: Placenta sent to pathology COMPLICATIONS: None immediate  PROCEDURE IN DETAIL:  The patient received intravenous antibiotics and had sequential compression devices applied to her lower extremities while in the preoperative area.  She was then taken to the operating room where spinal anesthesia was administered and was found to be adequate.  She was then placed in a dorsal supine position with a leftward tilt, and prepped and draped in a sterile manner.  A foley catheter was placed into her bladder and attached to constant gravity.  After an adequate timeout was performed, a Pfannenstiel skin incision was made with scalpel and carried through to the underlying layer of fascia. The fascia was incised in the midline and this incision was extended bilaterally using the Mayo scissors. Kocher clamps were applied to the superior aspect of the fascial incision and the underlying rectus muscles were dissected off bluntly. A similar process was carried out on the inferior aspect of the facial incision. The rectus muscles were separated in the midline bluntly and the peritoneum was entered bluntly.   A transverse hysterotomy was made with a scalpel and extended bilaterally bluntly. The bladder blade was then removed. The infant was successfully delivered, and cord was clamped and cut and infant was handed over to awaiting neonatology team. Uterine massage was then administered and the placenta delivered intact with three-vessel cord. The uterus was cleared of clot and debris.  The hysterotomy was closed with #1 Chromic.  A second imbricating suture of #1 Chromic was used to reinforce the incision and aid in hemostasis.  The peritoneum and rectus muscles were noted to be hemostatic and were reapproximated using 3-0 Monocryl in a running fashion.  The fascia was closed with 0-Vicryl in a running fashion with good restoration of anatomy.  The subcutaneus tissue was copiously irrigated.  The skin was closed with 4-0 Vicryl in a subcuticular fashion.  Pt tolerated the procedure will.  All counts were correct x2.  Pt went to the recovery room in stable condition.

## 2013-11-24 NOTE — Anesthesia Postprocedure Evaluation (Signed)
Anesthesia Post Note  Patient: Stacy Peck  Procedure(s) Performed: Procedure(s) (LRB): CESAREAN SECTION (N/A)  Anesthesia type: Spinal  Patient location: PACU  Post pain: Pain level controlled  Post assessment: Post-op Vital signs reviewed  Last Vitals:  Filed Vitals:   11/24/13 1330  BP: 102/50  Pulse: 117  Temp: 38.5 C  Resp: 21    Post vital signs: Reviewed  Level of consciousness: awake  Complications: No apparent anesthesia complications

## 2013-11-24 NOTE — H&P (Signed)
Stacy Peck is a 33 y.o. female presenting for flank pain, malaise and fever.  The patient started feeling badly Thursday with progressive worsening and Tmax at home 102.9.  She has left flank pain that hurts with deep inspiration.  Patient denies CTX, VB, and LOF and reports active FM.  Antepartum course complicated by GBS positive and hypothyroidism.  On arrival to MAU, FHT was significant for tachycardia and late decelerations.  IV fluid bolus was started which did improve FHT.  CVX was checked by RN and 3-4 cm but membranes were not felt.  WBC 19.5 and urinalysis +nitrites.  Maternal Medical History:  Reason for admission: Nausea.   Contractions: Frequency: rare.   Perceived severity is mild.    Fetal activity: Perceived fetal activity is normal.   Last perceived fetal movement was within the past hour.    Prenatal complications: no prenatal complications Prenatal Complications - Diabetes: none.    OB History   Grav Para Term Preterm Abortions TAB SAB Ect Mult Living   2 1 1       1      Past Medical History  Diagnosis Date  . Hypothyroidism   . Allergy    Past Surgical History  Procedure Laterality Date  . Diaphragmatic hernia repair    . Hernia repair     Family History: family history is negative for Hearing loss, Heart disease, Hypertension, Stroke, Diabetes, Asthma, and Cancer. Social History:  reports that she has never smoked. She has never used smokeless tobacco. She reports that she drinks alcohol. She reports that she does not use illicit drugs.   Prenatal Transfer Tool  Maternal Diabetes: No Genetic Screening: Normal Maternal Ultrasounds/Referrals: Normal Fetal Ultrasounds or other Referrals:  None Maternal Substance Abuse:  No Significant Maternal Medications:  Meds include: Syntroid Significant Maternal Lab Results:  Lab values include: Group B Strep positive Other Comments:  None  Review of Systems  Constitutional: Positive for fever and chills.  HENT:  Negative for congestion.   Respiratory: Negative for cough, shortness of breath and wheezing.   Cardiovascular: Negative for chest pain and palpitations.  Gastrointestinal: Positive for nausea and vomiting. Negative for abdominal pain.  Genitourinary: Positive for flank pain. Negative for dysuria, urgency, frequency and hematuria.  Musculoskeletal: Positive for back pain and myalgias.  Skin: Negative for itching and rash.    Dilation: 3.5 Effacement (%): 70 Station: -2 Exam by:: jolynn Blood pressure 111/72, pulse 100, temperature 99.9 F (37.7 C), temperature source Axillary, resp. rate 18, height 4\' 11"  (1.499 m), weight 118 lb (53.524 kg), SpO2 100.00%. Maternal Exam:  Uterine Assessment: Contraction strength is mild.  Contraction frequency is rare.   Abdomen: Patient reports no abdominal tenderness. Fundal height is c/w dates.   Estimated fetal weight is 7#6.       Physical Exam  Constitutional: She is oriented to person, place, and time. She appears well-developed and well-nourished.  GI: Soft. There is no rebound and no guarding.  Neurological: She is alert and oriented to person, place, and time.  Skin: Skin is warm and dry.  Psychiatric: She has a normal mood and affect. Her behavior is normal.    Prenatal labs: ABO, Rh:   Antibody: Negative (11/13 0000) Rubella: Nonimmune (11/13 0000) RPR: Nonreactive (11/13 0000)  HBsAg: Negative (11/13 0000)  HIV: Non-reactive (11/13 0000)  GBS: Positive (05/27 0000)   Assessment/Plan: 33yo G2P1001 at [redacted]w[redacted]d with pyelonephritis -BPP  -Rocephin -If FHT worsens, will proceed with C/S   MORRIS, MEGAN  11/24/2013, 10:03 AM

## 2013-11-24 NOTE — MAU Note (Signed)
Not feeling well, started running a temp on Thurs. Was at dr's office yesterday,was afebrile- urine was normal also.  Highest temp was this morning 102.9. No cough or sore throat. Some nausea, threw up once.  No constipation or diarrhea. Denies urinary symptoms.   Pain started Monday middle left back, moved to side and lower left ribs now

## 2013-11-24 NOTE — Anesthesia Preprocedure Evaluation (Signed)
Anesthesia Evaluation  Patient identified by MRN, date of birth, ID band Patient awake    Reviewed: Allergy & Precautions, H&P , NPO status , Patient's Chart, lab work & pertinent test results  Airway Mallampati: I TM Distance: >3 FB Neck ROM: full    Dental no notable dental hx.    Pulmonary neg pulmonary ROS,    Pulmonary exam normal       Cardiovascular negative cardio ROS      Neuro/Psych negative neurological ROS  negative psych ROS   GI/Hepatic negative GI ROS, Neg liver ROS,   Endo/Other    Renal/GU negative Renal ROS     Musculoskeletal   Abdominal Normal abdominal exam  (+)   Peds  Hematology negative hematology ROS (+)   Anesthesia Other Findings   Reproductive/Obstetrics (+) Pregnancy                           Anesthesia Physical Anesthesia Plan  ASA: II and emergent  Anesthesia Plan: Spinal   Post-op Pain Management:    Induction:   Airway Management Planned:   Additional Equipment:   Intra-op Plan:   Post-operative Plan:   Informed Consent: I have reviewed the patients History and Physical, chart, labs and discussed the procedure including the risks, benefits and alternatives for the proposed anesthesia with the patient or authorized representative who has indicated his/her understanding and acceptance.     Plan Discussed with: CRNA and Surgeon  Anesthesia Plan Comments:         Anesthesia Quick Evaluation

## 2013-11-24 NOTE — Transfer of Care (Signed)
Immediate Anesthesia Transfer of Care Note  Patient: Stacy Peck  Procedure(s) Performed: Procedure(s): CESAREAN SECTION (N/A)  Patient Location: PACU  Anesthesia Type:Spinal  Level of Consciousness: awake, alert  and oriented  Airway & Oxygen Therapy: Patient Spontanous Breathing  Post-op Assessment: Report given to PACU RN and Post -op Vital signs reviewed and stable  Post vital signs: Reviewed and stable  Complications: No apparent anesthesia complications

## 2013-11-24 NOTE — OR Nursing (Signed)
Call to report fever of 101.3 orders received to give tylenol 1000mg  po.

## 2013-11-24 NOTE — Progress Notes (Signed)
Dr. Santiago Bur called with BPP results of 2/8.  Counseled patient for immediate C/S.  Informed of risk of bleeding, infection, scarring, and damage to surrounding structures.  All questions were answered.  Will proceed.    Linda Hedges, DO

## 2013-11-25 LAB — CBC
HCT: 25.2 % — ABNORMAL LOW (ref 36.0–46.0)
Hemoglobin: 8.5 g/dL — ABNORMAL LOW (ref 12.0–15.0)
MCH: 32.7 pg (ref 26.0–34.0)
MCHC: 33.7 g/dL (ref 30.0–36.0)
MCV: 96.9 fL (ref 78.0–100.0)
Platelets: 186 10*3/uL (ref 150–400)
RBC: 2.6 MIL/uL — ABNORMAL LOW (ref 3.87–5.11)
RDW: 13.7 % (ref 11.5–15.5)
WBC: 19.5 10*3/uL — ABNORMAL HIGH (ref 4.0–10.5)

## 2013-11-25 MED ORDER — SODIUM CHLORIDE 0.9 % IV SOLN
INTRAVENOUS | Status: DC
  Administered 2013-11-25 (×2): via INTRAVENOUS

## 2013-11-25 MED ORDER — FERROUS SULFATE 325 (65 FE) MG PO TABS
325.0000 mg | ORAL_TABLET | Freq: Two times a day (BID) | ORAL | Status: DC
  Administered 2013-11-25 – 2013-11-27 (×5): 325 mg via ORAL
  Filled 2013-11-25 (×5): qty 1

## 2013-11-25 MED ORDER — DEXTROSE 5 % IV SOLN
1.0000 g | Freq: Two times a day (BID) | INTRAVENOUS | Status: DC
  Administered 2013-11-25 – 2013-11-26 (×5): 1 g via INTRAVENOUS
  Filled 2013-11-25 (×6): qty 10

## 2013-11-25 NOTE — Addendum Note (Signed)
Addendum created 11/25/13 0926 by Flossie Dibble, CRNA   Modules edited: Notes Section   Notes Section:  File: [id number]

## 2013-11-25 NOTE — Anesthesia Postprocedure Evaluation (Signed)
Anesthesia Post Note  Patient: Stacy Peck  Procedure(s) Performed: Procedure(s) (LRB): CESAREAN SECTION (N/A)  Anesthesia type: Spinal  Patient location: Mother/Baby  Post pain: Pain level controlled  Post assessment: Post-op Vital signs reviewed  Last Vitals:  Filed Vitals:   11/25/13 0500  BP: 84/49  Pulse: 69  Temp: 36.7 C  Resp: 20    Post vital signs: Reviewed  Level of consciousness: awake  Complications: No apparent anesthesia complications

## 2013-11-25 NOTE — Progress Notes (Signed)
Subjective: Postpartum Day 1: Cesarean Delivery Patient reports incisional pain and tolerating PO.  No more flank pain.  Wants circ.  Objective: Vital signs in last 24 hours: Temp:  [97.3 F (36.3 C)-101.3 F (38.5 C)] 98 F (36.7 C) (06/28 0500) Pulse Rate:  [69-121] 69 (06/28 0500) Resp:  [14-21] 20 (06/28 0500) BP: (81-111)/(42-72) 84/49 mmHg (06/28 0500) SpO2:  [89 %-100 %] 97 % (06/28 0500) Weight:  [118 lb (53.524 kg)] 118 lb (53.524 kg) (06/27 0853)  Physical Exam:  General: alert, cooperative and appears stated age 33: appropriate Uterine Fundus: firm Incision: healing well, no significant drainage, no dehiscence.  Honeycomb on and clean. DVT Evaluation: No evidence of DVT seen on physical exam. Negative Homan's sign. No cords or calf tenderness.   Recent Labs  11/24/13 0925 11/25/13 0600  HGB 12.6 8.5*  HCT 36.6 25.2*    Assessment/Plan: Status post Cesarean section. Doing well postoperatively.  Continue current care. Pyelonephritis-No fever since yesterday at 1330.  Continue Rocephin until 24-48 hours afebrile. ABL anemia-ferrous sulfate Patient counseled for circ including risk of bleeding, infection, and scarring.  All questions were answered.   MORRIS, [place] 11/25/2013, 8:47 AM

## 2013-11-26 ENCOUNTER — Encounter (HOSPITAL_COMMUNITY): Payer: Self-pay | Admitting: Obstetrics & Gynecology

## 2013-11-26 LAB — COMPREHENSIVE METABOLIC PANEL
ALT: 7 U/L (ref 0–35)
AST: 17 U/L (ref 0–37)
Albumin: 1.8 g/dL — ABNORMAL LOW (ref 3.5–5.2)
Alkaline Phosphatase: 123 U/L — ABNORMAL HIGH (ref 39–117)
BUN: 7 mg/dL (ref 6–23)
CO2: 26 mEq/L (ref 19–32)
Calcium: 8.6 mg/dL (ref 8.4–10.5)
Chloride: 107 mEq/L (ref 96–112)
Creatinine, Ser: 0.58 mg/dL (ref 0.50–1.10)
GFR calc Af Amer: >90 mL/min (ref >90)
GFR calc non Af Amer: >90 mL/min (ref >90)
Glucose, Bld: 109 mg/dL — ABNORMAL HIGH (ref 70–99)
Potassium: 3.9 mEq/L (ref 3.7–5.3)
Sodium: 141 mEq/L (ref 137–147)
Total Bilirubin: <0.2 mg/dL — ABNORMAL LOW (ref 0.3–1.2)
Total Protein: 4.8 g/dL — ABNORMAL LOW (ref 6.0–8.3)

## 2013-11-26 LAB — CBC
HCT: 22.2 % — ABNORMAL LOW (ref 36.0–46.0)
Hemoglobin: 7.4 g/dL — ABNORMAL LOW (ref 12.0–15.0)
MCH: 32.5 pg (ref 26.0–34.0)
MCHC: 33.3 g/dL (ref 30.0–36.0)
MCV: 97.4 fL (ref 78.0–100.0)
Platelets: 186 10*3/uL (ref 150–400)
RBC: 2.28 MIL/uL — ABNORMAL LOW (ref 3.87–5.11)
RDW: 13.8 % (ref 11.5–15.5)
WBC: 15.4 10*3/uL — ABNORMAL HIGH (ref 4.0–10.5)

## 2013-11-26 LAB — RPR

## 2013-11-26 NOTE — Lactation Note (Signed)
This note was copied from the chart of [place]. Lactation Consultation Note Has a 2 yrs. Old child that she breast fed for 15 months. Stated had no difficulty. States BF with this baby going well. Denies soreness. Mom encouraged to feed baby 8-12 times/24 hours and with feeding cues. Reviewed Baby & Me book's Breastfeeding Basics. Mom encouraged to feed baby w/feeding cues. Mom reports + breast changes w/pregnancy. [place] brochure given w/resources, support groups and [place] services. Reminded mom BF newborn is different than BF older child. Reminded of newborn behavior. Patient Name: Stacy Peck [id number] Date: 11/26/2013     Maternal Data    Feeding Feeding Type: Breast Fed Length of feed: 15 min  LATCH Score/Interventions                      Lactation Tools Discussed/Used     Consult Status      CARVER, LAURA G 11/26/2013, 8:24 AM

## 2013-11-26 NOTE — Progress Notes (Signed)
Subjective: Postpartum Day 2: Cesarean Delivery Patient reports tolerating PO and no problems voiding.  Complains of swelling into R upper thigh and some paresthesia. Ambulating without difficulty  Objective: Vital signs in last 24 hours: Temp:  [97.5 F (36.4 C)-98.3 F (36.8 C)] 98.3 F (36.8 C) (06/29 0600) Pulse Rate:  [64-80] 80 (06/29 0600) Resp:  [18] 18 (06/29 0600) BP: (84-97)/(46-60) 97/60 mmHg (06/29 0600) SpO2:  [97 %] 97 % (06/28 2336)  Physical Exam:  General: alert and cooperative Lochia: appropriate Uterine Fundus: firm, non tender Incision: healing well, honeycomb dressing CDI DVT Evaluation: No evidence of DVT seen on physical exam. Negative Homan's sign. Negative flank pain.    Recent Labs  11/25/13 0600 11/26/13 0605  HGB 8.5* 7.4*  HCT 25.2* 22.2*  Urine culture not sent  Assessment/Plan: Status post Cesarean section. Postoperative course complicated by pyelonephritis responding well to antibiotics and anemia Continue current care.  CURTIS,CAROL G 11/26/2013, 8:20 AM

## 2013-11-27 ENCOUNTER — Ambulatory Visit: Payer: Self-pay

## 2013-11-27 LAB — COMPREHENSIVE METABOLIC PANEL
ALT: 13 U/L (ref 0–35)
AST: 23 U/L (ref 0–37)
Albumin: 1.6 g/dL — ABNORMAL LOW (ref 3.5–5.2)
Alkaline Phosphatase: 146 U/L — ABNORMAL HIGH (ref 39–117)
BUN: 10 mg/dL (ref 6–23)
CO2: 27 mEq/L (ref 19–32)
Calcium: 8.3 mg/dL — ABNORMAL LOW (ref 8.4–10.5)
Chloride: 105 mEq/L (ref 96–112)
Creatinine, Ser: 0.59 mg/dL (ref 0.50–1.10)
GFR calc Af Amer: >90 mL/min (ref >90)
GFR calc non Af Amer: >90 mL/min (ref >90)
Glucose, Bld: 77 mg/dL (ref 70–99)
Potassium: 4.3 mEq/L (ref 3.7–5.3)
Sodium: 142 mEq/L (ref 137–147)
Total Bilirubin: <0.2 mg/dL — ABNORMAL LOW (ref 0.3–1.2)
Total Protein: 4.3 g/dL — ABNORMAL LOW (ref 6.0–8.3)

## 2013-11-27 LAB — CBC
HCT: 23 % — ABNORMAL LOW (ref 36.0–46.0)
Hemoglobin: 7.6 g/dL — ABNORMAL LOW (ref 12.0–15.0)
MCH: 32.3 pg (ref 26.0–34.0)
MCHC: 33 g/dL (ref 30.0–36.0)
MCV: 97.9 fL (ref 78.0–100.0)
Platelets: 223 10*3/uL (ref 150–400)
RBC: 2.35 MIL/uL — ABNORMAL LOW (ref 3.87–5.11)
RDW: 13.8 % (ref 11.5–15.5)
WBC: 8.2 10*3/uL (ref 4.0–10.5)

## 2013-11-27 MED ORDER — MEASLES, MUMPS & RUBELLA VAC ~~LOC~~ INJ
0.5000 mL | INJECTION | Freq: Once | SUBCUTANEOUS | Status: AC
  Administered 2013-11-27: 0.5 mL via SUBCUTANEOUS
  Filled 2013-11-27 (×2): qty 0.5

## 2013-11-27 MED ORDER — OXYCODONE-ACETAMINOPHEN 5-325 MG PO TABS: 1.0000 | ORAL_TABLET | ORAL | Status: DC | PRN

## 2013-11-27 MED ORDER — CEPHALEXIN 500 MG PO CAPS: 500.0000 mg | ORAL_CAPSULE | Freq: Four times a day (QID) | ORAL | Status: DC

## 2013-11-27 MED ORDER — MEASLES, MUMPS & RUBELLA VAC ~~LOC~~ INJ: 0.5000 mL | INJECTION | Freq: Once | SUBCUTANEOUS | Status: DC

## 2013-11-27 MED ORDER — CEPHALEXIN 500 MG PO CAPS
500.0000 mg | ORAL_CAPSULE | Freq: Four times a day (QID) | ORAL | Status: DC
  Filled 2013-11-27: qty 1

## 2013-11-27 MED ORDER — FERROUS SULFATE 325 (65 FE) MG PO TABS: 325.0000 mg | ORAL_TABLET | Freq: Two times a day (BID) | ORAL | Status: DC

## 2013-11-27 MED ORDER — IBUPROFEN 600 MG PO TABS: 600.0000 mg | ORAL_TABLET | Freq: Four times a day (QID) | ORAL | Status: DC

## 2013-11-27 NOTE — Lactation Note (Signed)
This note was copied from the chart of Stacy Peck. Lactation Consultation Note  Patient Name: Stacy Peck Date: 11/27/2013 Reason for consult: Follow-up assessment Baby is in care seat and parents ready to go home. Mom reports baby is nursing well, she is hearing swallows with baby at the breast, denies tenderness. Mom reports her breasts are filling. She BF her 1st child for 15 months. LC reviewed engorgement care if needed. Advised of OP services and support group.   Maternal Data    Feeding    LATCH Score/Interventions                      Lactation Tools Discussed/Used     Consult Status Consult Status: Complete    Katrine Coho 11/27/2013, 12:02 PM

## 2013-11-27 NOTE — Plan of Care (Signed)
Problem: Discharge Progression Outcomes Goal: MMR given as ordered Outcome: Not Met (add Reason) Needs MMR prior to D/C     

## 2013-11-27 NOTE — Progress Notes (Signed)
Subjective: Postpartum Day 3: Cesarean Delivery Patient reports tolerating PO, + flatus and no problems voiding.  Complains of sacral edema  Objective: Vital signs in last 24 hours: Temp:  [97.8 F (36.6 C)-97.9 F (36.6 C)] 97.8 F (36.6 C) (06/30 0555) Pulse Rate:  [74-82] 74 (06/30 0555) Resp:  [18] 18 (06/30 0555) BP: (90-96)/(55-58) 90/55 mmHg (06/30 0555)  Physical Exam:  General: alert and cooperative Lochia: appropriate Uterine Fundus: firm Incision: healing well DVT Evaluation: No evidence of DVT seen on physical exam. Negative Homan's sign. No cords or calf tenderness. No significant calf/ankle edema.   Recent Labs  11/26/13 0605 11/27/13 0555  HGB 7.4* 7.6*  HCT 22.2* 23.0*    Assessment/Plan: Status post Cesarean section. Postoperative course complicated by pyelonephritis  Discharge home with standard precautions and return to clinic in 1 week.  CURTIS,CAROL G 11/27/2013, 8:54 AM

## 2013-11-27 NOTE — Discharge Summary (Signed)
Obstetric Discharge Summary Reason for Admission: observation/evaluation Prenatal Procedures: ultrasound Intrapartum Procedures: cesarean: low cervical, transverse Postpartum Procedures: antibiotics Complications-Operative and Postpartum: pyelonephritis Hemoglobin  Date Value Ref Range Status  11/27/2013 7.6* 12.0 - 15.0 g/dL Final     HCT  Date Value Ref Range Status  11/27/2013 23.0* 36.0 - 46.0 % Final    Physical Exam:  General: alert and cooperative Lochia: appropriate Uterine Fundus: firm Incision: healing well DVT Evaluation: No evidence of DVT seen on physical exam. Negative Homan's sign. No cords or calf tenderness. No significant calf/ankle edema.  Discharge Diagnoses: Term Pregnancy-delivered and pyelonephritis  Discharge Information: Date: 11/27/2013 Activity: pelvic rest Diet: routine Medications: PNV, Ibuprofen, Percocet and keflex Condition: stable Instructions: refer to practice specific booklet Discharge to: home   Newborn Data: Live born female  Birth Weight: 7 lb 7.1 oz (3375 g) APGAR: 8, 9  Home with mother.  CURTIS,CAROL G 11/27/2013, 9:00 AM

## 2013-11-29 ENCOUNTER — Inpatient Hospital Stay (HOSPITAL_COMMUNITY): Admission: RE | Admit: 2013-11-29 | Payer: BC Managed Care – PPO | Source: Ambulatory Visit

## 2014-01-07 ENCOUNTER — Other Ambulatory Visit: Payer: Self-pay | Admitting: Obstetrics and Gynecology

## 2014-01-08 ENCOUNTER — Other Ambulatory Visit: Payer: Self-pay | Admitting: Dermatology

## 2014-01-08 LAB — CYTOLOGY - PAP

## 2014-03-21 ENCOUNTER — Ambulatory Visit (INDEPENDENT_AMBULATORY_CARE_PROVIDER_SITE_OTHER): Payer: BC Managed Care – PPO | Admitting: Family Medicine

## 2014-03-21 VITALS — BP 118/64 | HR 72 | Temp 98.5°F | Resp 18 | Ht 60.0 in | Wt 100.0 lb

## 2014-03-21 DIAGNOSIS — J209 Acute bronchitis, unspecified: Secondary | ICD-10-CM

## 2014-03-21 MED ORDER — AZITHROMYCIN 250 MG PO TABS: ORAL_TABLET | ORAL | Status: DC

## 2014-03-21 NOTE — Patient Instructions (Signed)

## 2014-03-21 NOTE — Progress Notes (Signed)
This chart was scribed for Stacy Haber, MD by Stacy Peck, ED Scribe. This patient was seen in room 4 and the patient's care was started at 9:26 AM.    Patient ID: Stacy Peck MRN: [id number], DOB: 22-Oct-1980, 33 y.o. Date of Encounter: 03/21/2014, 9:26 AM  Primary Physician: No PCP Per Patient  Chief Complaint: cough  HPI: 33 y.o. year old female with history below presents with cough with onset 8 days ago. She states her daughter is sick with strep throat and is on an antibiotic. She states her cough was productive the first few days but has become dry since then, with irritation to her throat. She denies history of asthma or smoking. She states she does not work outside of home. She states she is breast feeding. She states she was 2 children: a daughter who is 66 y/o and a son who is 4 m/o.  Past Medical History  Diagnosis Date  . Hypothyroidism   . Allergy      Home Meds: Prior to Admission medications   Medication Sig Start Date End Date Taking? Authorizing Provider  levothyroxine (SYNTHROID, LEVOTHROID) 75 MCG tablet Take 75 mcg by mouth daily.     Yes Historical Provider, MD  Prenatal Vit-Fe Fumarate-FA (PRENATAL MULTIVITAMIN) TABS tablet Take 1 tablet by mouth daily at 12 noon.   Yes Historical Provider, MD  acetaminophen (TYLENOL) 500 MG tablet Take 1,000 mg by mouth every 6 (six) hours as needed.    Historical Provider, MD  cephALEXin (KEFLEX) 500 MG capsule Take 1 capsule (500 mg total) by mouth every 6 (six) hours. 11/27/13   Damien Fusi, NP  ferrous sulfate 325 (65 FE) MG tablet Take 1 tablet (325 mg total) by mouth 2 (two) times daily with a meal. 11/27/13   Damien Fusi, NP  ibuprofen (ADVIL,MOTRIN) 600 MG tablet Take 1 tablet (600 mg total) by mouth every 6 (six) hours. 11/27/13   Damien Fusi, NP  oxyCODONE-acetaminophen (PERCOCET/ROXICET) 5-325 MG per tablet Take 1-2 tablets by mouth every 4 (four) hours as needed for severe pain (moderate - severe pain). 11/27/13    Damien Fusi, NP    Allergies: No Known Allergies  History   Social History  . Marital Status: Married    Spouse Name: N/A    Number of Children: N/A  . Years of Education: N/A   Occupational History  . Not on file.   Social History Main Topics  . Smoking status: Never Smoker   . Smokeless tobacco: Never Used  . Alcohol Use: Yes     Comment: not during preg  . Drug Use: No  . Sexual Activity: Yes   Other Topics Concern  . Not on file   Social History Narrative  . No narrative on file     Review of Systems: Constitutional: negative for chills, fever, night sweats, weight changes, or fatigue  HEENT: negative for vision changes, hearing loss, congestion, rhinorrhea, ST, epistaxis, or sinus pressure, positive for irritated throat Cardiovascular: negative for chest pain or palpitations Respiratory: negative for hemoptysis, wheezing, shortness of breath, positive for cough Abdominal: negative for abdominal pain, nausea, vomiting, diarrhea, or constipation Dermatological: negative for rash Neurologic: negative for headache, dizziness, or syncope All other systems reviewed and are otherwise negative with the exception to those above and in the HPI.   Physical Exam: Blood pressure 118/64, pulse 72, temperature 98.5 F (36.9 C), temperature source Oral, resp. rate 18, height 5' (1.524 m), weight 100 lb (  45.36 kg), SpO2 100.00%, unknown if currently breastfeeding., Body mass index is 19.53 kg/(m^2). General: Well developed, well nourished, in no acute distress. Head: Normocephalic, atraumatic, eyes without discharge, sclera non-icteric, nares are without discharge. Bilateral auditory canals clear, TM's are without perforation, pearly grey and translucent with reflective cone of light bilaterally. Oral cavity moist, posterior pharynx without exudate, erythema, peritonsillar abscess, or post nasal drip.  Neck: Supple. No thyromegaly. Full ROM. No lymphadenopathy. Lungs: Clear  bilaterally to auscultation without wheezes, rales, or rhonchi. Breathing is unlabored. Heart: RRR with S1 S2. No murmurs, rubs, or gallops appreciated. Abdomen: Soft, non-tender, non-distended with normoactive bowel sounds. No hepatomegaly. No rebound/guarding. No obvious abdominal masses. Msk:  Strength and tone normal for age. Extremities/Skin: Warm and dry. No clubbing or cyanosis. No edema. No rashes or suspicious lesions. Neuro: Alert and oriented X 3. Moves all extremities spontaneously. Gait is normal. CNII-XII grossly in tact. Psych:  Responds to questions appropriately with a normal affect.    ASSESSMENT AND PLAN:  33 y.o. year old female with Acute bronchitis, unspecified organism - Plan: azithromycin (ZITHROMAX Z-PAK) 250 MG tablet     Signed, Stacy Haber, MD 03/21/2014 9:26 AM

## 2014-12-29 ENCOUNTER — Emergency Department (HOSPITAL_BASED_OUTPATIENT_CLINIC_OR_DEPARTMENT_OTHER): Payer: BLUE CROSS/BLUE SHIELD

## 2014-12-29 ENCOUNTER — Emergency Department (HOSPITAL_BASED_OUTPATIENT_CLINIC_OR_DEPARTMENT_OTHER)
Admission: EM | Admit: 2014-12-29 | Discharge: 2014-12-29 | Disposition: A | Discharge status: Home / Self Care | Payer: BLUE CROSS/BLUE SHIELD | Attending: Emergency Medicine | Admitting: Emergency Medicine

## 2014-12-29 ENCOUNTER — Encounter (HOSPITAL_BASED_OUTPATIENT_CLINIC_OR_DEPARTMENT_OTHER): Payer: Self-pay | Admitting: *Deleted

## 2014-12-29 DIAGNOSIS — S0083XA Contusion of other part of head, initial encounter: Secondary | ICD-10-CM | POA: Diagnosis not present

## 2014-12-29 DIAGNOSIS — E039 Hypothyroidism, unspecified: Secondary | ICD-10-CM | POA: Diagnosis not present

## 2014-12-29 DIAGNOSIS — Y9289 Other specified places as the place of occurrence of the external cause: Secondary | ICD-10-CM | POA: Insufficient documentation

## 2014-12-29 DIAGNOSIS — Y9389 Activity, other specified: Secondary | ICD-10-CM | POA: Diagnosis not present

## 2014-12-29 DIAGNOSIS — S060X0A Concussion without loss of consciousness, initial encounter: Secondary | ICD-10-CM | POA: Insufficient documentation

## 2014-12-29 DIAGNOSIS — W19XXXA Unspecified fall, initial encounter: Secondary | ICD-10-CM

## 2014-12-29 DIAGNOSIS — S199XXA Unspecified injury of neck, initial encounter: Secondary | ICD-10-CM | POA: Diagnosis present

## 2014-12-29 DIAGNOSIS — Y998 Other external cause status: Secondary | ICD-10-CM | POA: Diagnosis not present

## 2014-12-29 DIAGNOSIS — S161XXA Strain of muscle, fascia and tendon at neck level, initial encounter: Secondary | ICD-10-CM | POA: Insufficient documentation

## 2014-12-29 DIAGNOSIS — Z79899 Other long term (current) drug therapy: Secondary | ICD-10-CM | POA: Diagnosis not present

## 2014-12-29 DIAGNOSIS — W01198A Fall on same level from slipping, tripping and stumbling with subsequent striking against other object, initial encounter: Secondary | ICD-10-CM | POA: Insufficient documentation

## 2014-12-29 DIAGNOSIS — S0093XA Contusion of unspecified part of head, initial encounter: Secondary | ICD-10-CM

## 2014-12-29 NOTE — Discharge Instructions (Signed)
If you were given medicines take as directed.  If you are on coumadin or contraceptives realize their levels and effectiveness is altered by many different medicines.  If you have any reaction (rash, tongues swelling, other) to the medicines stop taking and see a physician.    If your blood pressure was elevated in the ER make sure you follow up for management with a primary doctor or return for chest pain, shortness of breath or stroke symptoms.  Please follow up as directed and return to the ER or see a physician for new or worsening symptoms.  Thank you. Filed Vitals:   12/29/14 1812 12/29/14 1814  BP: 130/87   Pulse: 84   Temp: 97.7 F (36.5 C)   TempSrc: Oral   Resp: 16   Height:  4\' 11"  (1.499 m)  Weight:  95 lb (43.092 kg)  SpO2: 100%

## 2014-12-29 NOTE — ED Notes (Addendum)
Pt reports ETOH intake on Thursday night.  States that she fell of the toilet hitting her head.  Denies nausea, denies LOC.  Confusion after falling.  Reports that she continues to feel 'fuzzy' and 'not normal'

## 2014-12-29 NOTE — ED Notes (Signed)
"  Feel better", alert, NAD, calm, interactive, resps e/u, VSS. Denies questions or needs.

## 2014-12-29 NOTE — ED Notes (Addendum)
Pt ambulatory to xray, alert, NAD, calm, interactive, steady gait. Report received from [facility], RN.

## 2014-12-29 NOTE — ED Provider Notes (Signed)
CSN: [id number]     Arrival date & time 12/29/14  1745 History  This chart was scribed for Stacy Morrison, MD by Helane Gunther, ED Scribe. This patient was seen in room MHFT1/MHFT1 and the patient's care was started at 6:50 PM.     Chief Complaint  Patient presents with  . Fall   The history is provided by the patient. No language interpreter was used.   HPI Comments: Stacy Peck is a 34 y.o. female who presents to the Emergency Department complaining of a fall 3 days ago. Pt states she was drinking heavily and hit the right front of her head when she fell on the toilet. She reports associated HA, neck pain, dizziness, swelling and bruising to the head, as well as a dent to the head at the point of impact. She notes that she still feels "not quite right." Pt denies vomiting, weakness, numbness, and gait problems.  Past Medical History  Diagnosis Date  . Hypothyroidism   . Allergy    Past Surgical History  Procedure Laterality Date  . Diaphragmatic hernia repair    . Hernia repair    . Cesarean section N/A 11/24/2013    Procedure: CESAREAN SECTION;  Surgeon: Linda Hedges, DO;  Location: [place] [facility];  Service: Obstetrics;  Laterality: N/A;   Family History  Problem Relation Age of Onset  . Hearing loss Neg Hx   . Heart disease Neg Hx   . Hypertension Neg Hx   . Stroke Neg Hx   . Diabetes Neg Hx   . Asthma Neg Hx   . Cancer Neg Hx    History  Substance Use Topics  . Smoking status: Never Smoker   . Smokeless tobacco: Never Used  . Alcohol Use: Yes     Comment: not during preg   OB History    Gravida Para Term Preterm AB TAB SAB Ectopic Multiple Living   2 2 2       2      Review of Systems  Musculoskeletal: Positive for neck pain.  Skin: Positive for color change.  Neurological: Positive for dizziness and headaches.  All other systems reviewed and are negative.   Allergies  Review of patient's allergies indicates no known allergies.  Home Medications   Prior to Admission  medications   Medication Sig Start Date End Date Taking? Authorizing Provider  levothyroxine (SYNTHROID, LEVOTHROID) 75 MCG tablet Take 75 mcg by mouth daily.      Historical Provider, MD   BP 130/87 mmHg  Pulse 84  Temp(Src) 97.7 F (36.5 C) (Oral)  Resp 16  Ht 4\' 11"  (1.499 m)  Wt 95 lb (43.092 kg)  BMI 19.18 kg/m2  SpO2 100%  LMP  (LMP Unknown) Physical Exam  Constitutional: She is oriented to person, place, and time. She appears well-developed and well-nourished. No distress.  HENT:  Head: Normocephalic.  Mouth/Throat: Oropharynx is clear and moist.  Contusion right forehead, no step off or significant hematoma Neck supple mild midline C 7 tenderness  Eyes: Conjunctivae and EOM are normal. Pupils are equal, round, and reactive to light.  Neck: Normal range of motion. Neck supple. No tracheal deviation present.  Cardiovascular: Normal rate.   Pulmonary/Chest: Breath sounds normal. No respiratory distress.  Abdominal: Soft.  Musculoskeletal: Normal range of motion.  Neurological: She is alert and oriented to person, place, and time.  Reflex Scores:      Patellar reflexes are 2+ on the right side and 2+ on the left side.  Achilles reflexes are 2+ on the right side and 2+ on the left side. 5+ strength in UE and LE with f/e at major joints. Sensation to palpation intact in UE and LE. CNs 2-12 grossly intact.  EOMFI.  PERRL.   Finger nose and coordination intact bilateral.   Visual fields intact to finger testing.   Skin: Skin is warm and dry.  Psychiatric: She has a normal mood and affect. Her behavior is normal.  Nursing note and vitals reviewed.   ED Course  Procedures  DIAGNOSTIC STUDIES: Oxygen Saturation is 100% on RA, normal by my interpretation.    COORDINATION OF CARE: 7:03 PM - Discussed plans to order diagnostic imaging. Advised to rest the brain and avoid excessive exercise. Recommended pt find a PCP. Pt advised of plan for treatment and pt  agrees.  Labs Review Labs Reviewed - No data to display  Imaging Review Dg Cervical Spine Complete  12/29/2014   CLINICAL DATA:  Fall in bathroom with neck pain.  EXAM: CERVICAL SPINE  4+ VIEWS  COMPARISON:  None.  FINDINGS: Vertebral body alignment, heights and disc space heights are normal. There is minimal spondylosis of the cervical spine. There is mild uncovertebral joint spurring and facet arthropathy. Prevertebral soft tissues as well as the atlantoaxial articulation are normal. No evidence of acute fracture or subluxation.  IMPRESSION: No acute findings.  Early degenerative changes.   Electronically Signed   By: Marin Olp M.D.   On: 12/29/2014 19:21     EKG Interpretation None      MDM   Final diagnoses:  Cervical strain, initial encounter  Fall, initial encounter  Head contusion, initial encounter  Concussion, without loss of consciousness, initial encounter   Patient presents with clinical concern for concussion. Patient has forehead contusion, very low suspicion for intracranial injury or bleeding aside from concussion.  X-ray reviewed no acute fracture. Normal neuro exam. Patient stable for outpatient follow-up.  Results and differential diagnosis were discussed with the patient/parent/guardian. Xrays were independently reviewed by myself.  Close follow up outpatient was discussed, comfortable with the plan.   Medications - No data to display  Filed Vitals:   12/29/14 1812 12/29/14 1814  BP: 130/87   Pulse: 84   Temp: 97.7 F (36.5 C)   TempSrc: Oral   Resp: 16   Height:  4\' 11"  (1.499 m)  Weight:  95 lb (43.092 kg)  SpO2: 100%     Final diagnoses:  Cervical strain, initial encounter  Fall, initial encounter  Head contusion, initial encounter  Concussion, without loss of consciousness, initial encounter      Stacy Morrison, MD 12/29/14 2012

## 2014-12-29 NOTE — ED Notes (Addendum)
Out with family, Dr. Reather Converse in to see at time of d/c.

## 2015-05-07 ENCOUNTER — Other Ambulatory Visit: Payer: Self-pay | Admitting: Family Medicine

## 2015-05-07 DIAGNOSIS — R1031 Right lower quadrant pain: Secondary | ICD-10-CM

## 2015-05-12 ENCOUNTER — Other Ambulatory Visit: Payer: Self-pay | Admitting: Family Medicine

## 2015-05-12 DIAGNOSIS — R1031 Right lower quadrant pain: Secondary | ICD-10-CM

## 2015-06-05 ENCOUNTER — Other Ambulatory Visit: Payer: BLUE CROSS/BLUE SHIELD

## 2015-09-01 DIAGNOSIS — E039 Hypothyroidism, unspecified: Secondary | ICD-10-CM | POA: Diagnosis not present

## 2015-09-19 DIAGNOSIS — L700 Acne vulgaris: Secondary | ICD-10-CM | POA: Diagnosis not present

## 2015-09-19 DIAGNOSIS — L089 Local infection of the skin and subcutaneous tissue, unspecified: Secondary | ICD-10-CM | POA: Diagnosis not present

## 2015-09-19 DIAGNOSIS — L720 Epidermal cyst: Secondary | ICD-10-CM | POA: Diagnosis not present

## 2015-09-19 DIAGNOSIS — L814 Other melanin hyperpigmentation: Secondary | ICD-10-CM | POA: Diagnosis not present

## 2015-09-19 DIAGNOSIS — L0201 Cutaneous abscess of face: Secondary | ICD-10-CM | POA: Diagnosis not present

## 2015-09-23 DIAGNOSIS — L700 Acne vulgaris: Secondary | ICD-10-CM | POA: Diagnosis not present

## 2015-09-23 DIAGNOSIS — Z79899 Other long term (current) drug therapy: Secondary | ICD-10-CM | POA: Diagnosis not present

## 2015-09-30 DIAGNOSIS — F4010 Social phobia, unspecified: Secondary | ICD-10-CM | POA: Diagnosis not present

## 2015-10-04 DIAGNOSIS — H5711 Ocular pain, right eye: Secondary | ICD-10-CM | POA: Diagnosis not present

## 2015-10-04 DIAGNOSIS — S0501XA Injury of conjunctiva and corneal abrasion without foreign body, right eye, initial encounter: Secondary | ICD-10-CM | POA: Diagnosis not present

## 2015-10-04 DIAGNOSIS — H53141 Visual discomfort, right eye: Secondary | ICD-10-CM | POA: Diagnosis not present

## 2015-10-07 DIAGNOSIS — S0501XA Injury of conjunctiva and corneal abrasion without foreign body, right eye, initial encounter: Secondary | ICD-10-CM | POA: Diagnosis not present

## 2015-10-07 DIAGNOSIS — H53141 Visual discomfort, right eye: Secondary | ICD-10-CM | POA: Diagnosis not present

## 2015-10-07 DIAGNOSIS — H5711 Ocular pain, right eye: Secondary | ICD-10-CM | POA: Diagnosis not present

## 2015-10-24 DIAGNOSIS — L700 Acne vulgaris: Secondary | ICD-10-CM | POA: Diagnosis not present

## 2015-10-24 DIAGNOSIS — Z5181 Encounter for therapeutic drug level monitoring: Secondary | ICD-10-CM | POA: Diagnosis not present

## 2015-11-11 DIAGNOSIS — F4010 Social phobia, unspecified: Secondary | ICD-10-CM | POA: Diagnosis not present

## 2016-02-06 DIAGNOSIS — E039 Hypothyroidism, unspecified: Secondary | ICD-10-CM | POA: Diagnosis not present

## 2016-02-17 DIAGNOSIS — F4010 Social phobia, unspecified: Secondary | ICD-10-CM | POA: Diagnosis not present

## 2016-02-19 DIAGNOSIS — Z01419 Encounter for gynecological examination (general) (routine) without abnormal findings: Secondary | ICD-10-CM | POA: Diagnosis not present

## 2016-02-19 DIAGNOSIS — Z681 Body mass index (BMI) 19 or less, adult: Secondary | ICD-10-CM | POA: Diagnosis not present

## 2016-02-26 DIAGNOSIS — L700 Acne vulgaris: Secondary | ICD-10-CM | POA: Diagnosis not present

## 2016-02-26 DIAGNOSIS — D225 Melanocytic nevi of trunk: Secondary | ICD-10-CM | POA: Diagnosis not present

## 2016-02-26 DIAGNOSIS — Z86018 Personal history of other benign neoplasm: Secondary | ICD-10-CM | POA: Diagnosis not present

## 2016-02-26 DIAGNOSIS — D2230 Melanocytic nevi of unspecified part of face: Secondary | ICD-10-CM | POA: Diagnosis not present

## 2016-03-11 DIAGNOSIS — K130 Diseases of lips: Secondary | ICD-10-CM | POA: Diagnosis not present

## 2016-03-11 DIAGNOSIS — Z23 Encounter for immunization: Secondary | ICD-10-CM | POA: Diagnosis not present

## 2016-03-29 DIAGNOSIS — R42 Dizziness and giddiness: Secondary | ICD-10-CM | POA: Diagnosis not present

## 2016-03-29 DIAGNOSIS — R51 Headache: Secondary | ICD-10-CM | POA: Diagnosis not present

## 2016-08-05 DIAGNOSIS — F4010 Social phobia, unspecified: Secondary | ICD-10-CM | POA: Diagnosis not present

## 2016-09-16 DIAGNOSIS — F4010 Social phobia, unspecified: Secondary | ICD-10-CM | POA: Diagnosis not present

## 2016-09-17 DIAGNOSIS — E039 Hypothyroidism, unspecified: Secondary | ICD-10-CM | POA: Diagnosis not present

## 2016-09-17 DIAGNOSIS — R42 Dizziness and giddiness: Secondary | ICD-10-CM | POA: Diagnosis not present

## 2016-09-17 DIAGNOSIS — H538 Other visual disturbances: Secondary | ICD-10-CM | POA: Diagnosis not present

## 2016-12-09 DIAGNOSIS — F4010 Social phobia, unspecified: Secondary | ICD-10-CM | POA: Diagnosis not present

## 2017-01-03 DIAGNOSIS — Z3009 Encounter for other general counseling and advice on contraception: Secondary | ICD-10-CM | POA: Diagnosis not present

## 2017-01-27 DIAGNOSIS — F4010 Social phobia, unspecified: Secondary | ICD-10-CM | POA: Diagnosis not present

## 2017-02-03 DIAGNOSIS — E039 Hypothyroidism, unspecified: Secondary | ICD-10-CM | POA: Diagnosis not present

## 2017-02-10 DIAGNOSIS — E039 Hypothyroidism, unspecified: Secondary | ICD-10-CM | POA: Diagnosis not present

## 2017-03-10 DIAGNOSIS — D485 Neoplasm of uncertain behavior of skin: Secondary | ICD-10-CM | POA: Diagnosis not present

## 2017-03-10 DIAGNOSIS — Z808 Family history of malignant neoplasm of other organs or systems: Secondary | ICD-10-CM | POA: Diagnosis not present

## 2017-03-10 DIAGNOSIS — D225 Melanocytic nevi of trunk: Secondary | ICD-10-CM | POA: Diagnosis not present

## 2017-03-10 DIAGNOSIS — D2230 Melanocytic nevi of unspecified part of face: Secondary | ICD-10-CM | POA: Diagnosis not present

## 2017-03-22 DIAGNOSIS — F4010 Social phobia, unspecified: Secondary | ICD-10-CM | POA: Diagnosis not present

## 2017-04-05 DIAGNOSIS — Z681 Body mass index (BMI) 19 or less, adult: Secondary | ICD-10-CM | POA: Diagnosis not present

## 2017-04-05 DIAGNOSIS — Z01419 Encounter for gynecological examination (general) (routine) without abnormal findings: Secondary | ICD-10-CM | POA: Diagnosis not present

## 2017-09-06 DIAGNOSIS — F4010 Social phobia, unspecified: Secondary | ICD-10-CM | POA: Diagnosis not present

## 2018-02-07 DIAGNOSIS — E039 Hypothyroidism, unspecified: Secondary | ICD-10-CM | POA: Diagnosis not present

## 2018-02-14 DIAGNOSIS — E039 Hypothyroidism, unspecified: Secondary | ICD-10-CM | POA: Diagnosis not present

## 2018-02-14 DIAGNOSIS — Z23 Encounter for immunization: Secondary | ICD-10-CM | POA: Diagnosis not present

## 2018-02-15 DIAGNOSIS — K469 Unspecified abdominal hernia without obstruction or gangrene: Secondary | ICD-10-CM | POA: Diagnosis not present

## 2018-02-21 DIAGNOSIS — F4010 Social phobia, unspecified: Secondary | ICD-10-CM | POA: Diagnosis not present

## 2018-04-19 DIAGNOSIS — E039 Hypothyroidism, unspecified: Secondary | ICD-10-CM | POA: Diagnosis not present

## 2018-04-20 ENCOUNTER — Other Ambulatory Visit: Payer: Self-pay

## 2018-04-20 MED ORDER — BUSPIRONE HCL 15 MG PO TABS: ORAL_TABLET | ORAL | 1 refills | Status: DC

## 2018-05-15 DIAGNOSIS — Z01419 Encounter for gynecological examination (general) (routine) without abnormal findings: Secondary | ICD-10-CM | POA: Diagnosis not present

## 2018-05-15 DIAGNOSIS — Z681 Body mass index (BMI) 19 or less, adult: Secondary | ICD-10-CM | POA: Diagnosis not present

## 2018-06-21 ENCOUNTER — Encounter: Payer: Self-pay | Admitting: Emergency Medicine

## 2018-06-21 DIAGNOSIS — F4010 Social phobia, unspecified: Secondary | ICD-10-CM | POA: Insufficient documentation

## 2018-06-27 DIAGNOSIS — Z808 Family history of malignant neoplasm of other organs or systems: Secondary | ICD-10-CM | POA: Diagnosis not present

## 2018-06-27 DIAGNOSIS — D225 Melanocytic nevi of trunk: Secondary | ICD-10-CM | POA: Diagnosis not present

## 2018-06-27 DIAGNOSIS — D2261 Melanocytic nevi of right upper limb, including shoulder: Secondary | ICD-10-CM | POA: Diagnosis not present

## 2018-06-27 DIAGNOSIS — D2230 Melanocytic nevi of unspecified part of face: Secondary | ICD-10-CM | POA: Diagnosis not present

## 2018-08-10 ENCOUNTER — Encounter: Payer: Self-pay | Admitting: Physician Assistant

## 2018-08-10 ENCOUNTER — Ambulatory Visit: Payer: BLUE CROSS/BLUE SHIELD | Admitting: Physician Assistant

## 2018-08-10 ENCOUNTER — Other Ambulatory Visit: Payer: Self-pay

## 2018-08-10 DIAGNOSIS — F4010 Social phobia, unspecified: Secondary | ICD-10-CM

## 2018-08-10 DIAGNOSIS — F3289 Other specified depressive episodes: Secondary | ICD-10-CM | POA: Diagnosis not present

## 2018-08-10 MED ORDER — ALPRAZOLAM 0.5 MG PO TABS: 0.5000 mg | ORAL_TABLET | Freq: Every evening | ORAL | 5 refills | Status: DC | PRN

## 2018-08-10 MED ORDER — DULOXETINE HCL 30 MG PO CPEP: 30.0000 mg | ORAL_CAPSULE | Freq: Every day | ORAL | 1 refills | Status: DC

## 2018-08-10 MED ORDER — BUSPIRONE HCL 15 MG PO TABS: 15.0000 mg | ORAL_TABLET | Freq: Two times a day (BID) | ORAL | 5 refills | Status: DC

## 2018-08-10 NOTE — Progress Notes (Signed)
[facility]  Patient ID: Stacy Peck,  MRN: [id number]  PCP: Patient, No Pcp Per  Date of Evaluation: 08/10/2018 Time spent:15 minutes  Chief Complaint:  Chief Complaint    Follow-up      HISTORY/CURRENT STATUS: HPI here for 27-month med check.  Patient states she still has anxiety.  Not triggered at all except when she is out in public.  She can be at home and become anxious for no reason.  It is more of a generalized anxiety, not panic attack with symptoms such as palpitations, shortness of breath, chest tightness, or sweaty palms.  This is on a daily basis.  She still unsure if the BuSpar is working but year or so ago we had stopped it and she did notice a slight worsening of the anxiety so we have restarted the BuSpar.  At that time, she states it did help some.  A new symptom is that she had depression last week for about 3 days.  States it was a feeling of being down for no reason.  She did not want to do anything and wanted to stay in bed although she pushed herself to exercise.  She had no suicidal or homicidal thoughts.  "I do not know where that came from.  It came out of nowhere."  Her energy and motivation were low at that time.  She snapped out of it quickly, she states.  Denies muscle or joint pain, stiffness, or dystonia.  Denies dizziness, syncope, seizures, numbness, tingling, tremor, tics, unsteady gait, slurred speech, confusion.   Individual Medical History/ Review of Systems: Changes? :No    Past medications for mental health diagnoses include: Zoloft caused nausea, Prozac caused acne questionably, BuSpar, Luvox caused insomnia  Allergies: Patient has no known allergies.  Current Medications:  Current Outpatient Medications:  .  ALPRAZolam (XANAX) 0.5 MG tablet, Take 1 tablet (0.5 mg total) by mouth at bedtime as needed for anxiety., Disp: 30 tablet, Rfl: 5 .  busPIRone (BUSPAR) 15 MG tablet, Take 1 tablet (15 mg total) by mouth 2 (two) times daily.  Take 1 Tablet by mouth Twice a day, Disp: 60 tablet, Rfl: 5 .  levothyroxine (SYNTHROID, LEVOTHROID) 75 MCG tablet, Take 75 mcg by mouth daily.  , Disp: , Rfl:  .  spironolactone (ALDACTONE) 100 MG tablet, Take 100 mg by mouth daily., Disp: , Rfl:  .  TRI-PREVIFEM 0.18/0.215/0.25 MG-35 MCG tablet, Take 1 tablet by mouth daily., Disp: , Rfl:  .  DULoxetine (CYMBALTA) 30 MG capsule, Take 1 capsule (30 mg total) by mouth daily., Disp: 30 capsule, Rfl: 1 Medication Side Effects: none  Family Medical/ Social History: Changes? No  MENTAL HEALTH EXAM:  unknown if currently breastfeeding.There is no height or weight on file to calculate BMI.  General Appearance: Casual and Well Groomed  Eye Contact:  Good  Speech:  Clear and Coherent  Volume:  Normal  Mood:  Euthymic  Affect:  Appropriate  Thought Process:  Goal Directed  Orientation:  Full (Time, Place, and Person)  Thought Content: Logical   Suicidal Thoughts:  No  Homicidal Thoughts:  No  Memory:  WNL  Judgement:  Good  Insight:  Good  Psychomotor Activity:  Normal  Concentration:  Concentration: Good  Recall:  Good  Fund of Knowledge: Good  Language: Good  Assets:  Desire for Improvement  ADL's:  Intact  Cognition: WNL  Prognosis:  Good    DIAGNOSES:    ICD-10-CM   1. Social anxiety disorder  F40.10   2. Other depression F32.89     Receiving Psychotherapy: No    RECOMMENDATIONS: I recommend we try an SNRI.  She is never taken one.  She agrees. Start Cymbalta 30 mg p.o. daily.  Discussed risks, benefits, side effects.  Patient understands and accepts. Continue Xanax 0.5 mg half to 1 p.o. nightly as needed. Continue BuSpar 15 mg twice daily.  If she responds well to the Cymbalta, we may be able to DC the BuSpar however. Discussed coping techniques. Return in 4 to 6 weeks.   Donnal Moat, PA-C   This record has been created using [employer].  Chart creation errors have been sought, but may not always have been  located and corrected. Such creation errors do not reflect on the standard of medical care.

## 2018-08-14 DIAGNOSIS — R079 Chest pain, unspecified: Secondary | ICD-10-CM | POA: Diagnosis not present

## 2018-08-14 DIAGNOSIS — R0602 Shortness of breath: Secondary | ICD-10-CM | POA: Diagnosis not present

## 2018-09-14 ENCOUNTER — Other Ambulatory Visit: Payer: Self-pay

## 2018-09-14 ENCOUNTER — Ambulatory Visit (INDEPENDENT_AMBULATORY_CARE_PROVIDER_SITE_OTHER): Payer: BLUE CROSS/BLUE SHIELD | Admitting: Physician Assistant

## 2018-09-14 ENCOUNTER — Encounter: Payer: Self-pay | Admitting: Physician Assistant

## 2018-09-14 DIAGNOSIS — F4010 Social phobia, unspecified: Secondary | ICD-10-CM | POA: Diagnosis not present

## 2018-09-14 NOTE — Progress Notes (Signed)
[facility]  Patient ID: Stacy Peck,  MRN: [id number]  PCP: Patient, No Pcp Per  Date of Evaluation: 09/14/2018 Time spent:15 minutes  Chief Complaint:  Chief Complaint    Follow-up     Virtual Visit via Telephone Note  I connected with Jenetta Loges on 09/14/18 at 11:30 AM EDT by telephone and verified that I am speaking with the correct person using two identifiers.   I discussed the limitations, risks, security and privacy concerns of performing an evaluation and management service by telephone and the availability of in person appointments. I also discussed with the patient that there may be a patient responsible charge related to this service. The patient expressed understanding and agreed to proceed.   HISTORY/CURRENT STATUS: HPI for routine med check.  About 6 weeks ago, we started Cymbalta.  She took a 30 mg pill and became so anxious that EMS had to be called to their home.  They told her that she was having a panic attack.  After that, she did not take any more of the Cymbalta.  "I think I like things just the way they are."  States anxiety is better controlled than it was before she started medications and she does not want to try any other medicines.  She still only uses half a Xanax at night and that is not every night.  Patient denies loss of interest in usual activities and is able to enjoy things.  Denies decreased energy or motivation.  Appetite has not changed.  No extreme sadness, tearfulness, or feelings of hopelessness.  Denies any changes in concentration, making decisions or remembering things.  Denies suicidal or homicidal thoughts.  Denies muscle or joint pain, stiffness, or dystonia.  Denies dizziness, syncope, seizures, numbness, tingling, tremor, tics, unsteady gait, slurred speech, confusion.   Individual Medical History/ Review of Systems: Changes? :No    Past medications for mental health diagnoses include: Zoloft caused nausea, Prozac caused  acne questionably, BuSpar, Luvox caused insomnia  Allergies: Patient has no known allergies.  Current Medications:  Current Outpatient Medications:  .  ALPRAZolam (XANAX) 0.5 MG tablet, Take 1 tablet (0.5 mg total) by mouth at bedtime as needed for anxiety., Disp: 30 tablet, Rfl: 5 .  busPIRone (BUSPAR) 15 MG tablet, Take 1 tablet (15 mg total) by mouth 2 (two) times daily. Take 1 Tablet by mouth Twice a day, Disp: 60 tablet, Rfl: 5 .  levothyroxine (SYNTHROID, LEVOTHROID) 75 MCG tablet, Take 75 mcg by mouth daily.  , Disp: , Rfl:  .  spironolactone (ALDACTONE) 100 MG tablet, Take 100 mg by mouth daily., Disp: , Rfl:  .  TRI-PREVIFEM 0.18/0.215/0.25 MG-35 MCG tablet, Take 1 tablet by mouth daily., Disp: , Rfl:  Medication Side Effects: anxiety She took 1 pill of the Cymbalta, got super anxious, see above  Family Medical/ Social History: Changes? Yes coronavirus pandemic is keeping the kids at home.  MENTAL HEALTH EXAM:  unknown if currently breastfeeding.There is no height or weight on file to calculate BMI.  General Appearance: Phone visit, unable to assess  Eye Contact:  Unable to assess  Speech:  Clear and Coherent  Volume:  Normal  Mood:  Euthymic  Affect:  Unable to assess  Thought Process:  Goal Directed  Orientation:  Full (Time, Place, and Person)  Thought Content: Logical   Suicidal Thoughts:  No  Homicidal Thoughts:  No  Memory:  WNL  Judgement:  Good  Insight:  Good  Psychomotor Activity:  Unable to  assess  Concentration:  Concentration: Good  Recall:  Good  Fund of Knowledge: Good  Language: Good  Assets:  Desire for Improvement  ADL's:  Intact  Cognition: WNL  Prognosis:  Good    DIAGNOSES:    ICD-10-CM   1. Social anxiety disorder F40.10     Receiving Psychotherapy: No    RECOMMENDATIONS: Continue BuSpar 15 mg p.o. twice daily. Continue Xanax 0.5 mg 1/2-1 daily as needed. Discussed coping techniques. Returning  6 months.   Donnal Moat,  PA-C   This record has been created using [employer].  Chart creation errors have been sought, but may not always have been located and corrected. Such creation errors do not reflect on the standard of medical care.

## 2018-09-27 ENCOUNTER — Other Ambulatory Visit: Payer: Self-pay | Admitting: Physician Assistant

## 2018-11-30 DIAGNOSIS — S30861A Insect bite (nonvenomous) of abdominal wall, initial encounter: Secondary | ICD-10-CM | POA: Diagnosis not present

## 2019-02-15 DIAGNOSIS — E039 Hypothyroidism, unspecified: Secondary | ICD-10-CM | POA: Diagnosis not present

## 2019-02-21 DIAGNOSIS — E039 Hypothyroidism, unspecified: Secondary | ICD-10-CM | POA: Diagnosis not present

## 2019-03-14 ENCOUNTER — Other Ambulatory Visit: Payer: Self-pay | Admitting: Physician Assistant

## 2019-05-04 DIAGNOSIS — U071 COVID-19: Secondary | ICD-10-CM | POA: Diagnosis not present

## 2019-05-15 DIAGNOSIS — E039 Hypothyroidism, unspecified: Secondary | ICD-10-CM | POA: Diagnosis not present

## 2019-06-10 ENCOUNTER — Other Ambulatory Visit: Payer: Self-pay | Admitting: Physician Assistant

## 2019-06-10 NOTE — Telephone Encounter (Signed)
Last apt 08/2018 hasn't had f/u nothing scheduled

## 2019-08-01 DIAGNOSIS — Z3040 Encounter for surveillance of contraceptives, unspecified: Secondary | ICD-10-CM | POA: Diagnosis not present

## 2019-08-01 DIAGNOSIS — Z01419 Encounter for gynecological examination (general) (routine) without abnormal findings: Secondary | ICD-10-CM | POA: Diagnosis not present

## 2019-08-01 DIAGNOSIS — Z1231 Encounter for screening mammogram for malignant neoplasm of breast: Secondary | ICD-10-CM | POA: Diagnosis not present

## 2019-08-01 DIAGNOSIS — Z681 Body mass index (BMI) 19 or less, adult: Secondary | ICD-10-CM | POA: Diagnosis not present

## 2019-08-20 ENCOUNTER — Other Ambulatory Visit: Payer: Self-pay | Admitting: Physician Assistant

## 2019-08-21 NOTE — Telephone Encounter (Signed)
Last visit 08/2018

## 2019-08-31 DIAGNOSIS — Z79899 Other long term (current) drug therapy: Secondary | ICD-10-CM | POA: Diagnosis not present

## 2019-08-31 DIAGNOSIS — L700 Acne vulgaris: Secondary | ICD-10-CM | POA: Diagnosis not present

## 2019-09-27 ENCOUNTER — Telehealth: Payer: Self-pay | Admitting: Physician Assistant

## 2019-09-27 NOTE — Telephone Encounter (Signed)
Pt called and made appt for 5/3. She wants to know if she can get a refill on her buspar, and xanax. Please send to [facility] in [place] [address].

## 2019-09-28 ENCOUNTER — Other Ambulatory Visit: Payer: Self-pay | Admitting: Physician Assistant

## 2019-09-28 MED ORDER — BUSPIRONE HCL 15 MG PO TABS: 15.0000 mg | ORAL_TABLET | Freq: Two times a day (BID) | ORAL | 0 refills | Status: DC

## 2019-09-28 MED ORDER — ALPRAZOLAM 0.5 MG PO TABS: 0.5000 mg | ORAL_TABLET | Freq: Every evening | ORAL | 1 refills | Status: DC | PRN

## 2019-09-28 NOTE — Telephone Encounter (Signed)
Rx was sent  

## 2019-10-01 ENCOUNTER — Ambulatory Visit (INDEPENDENT_AMBULATORY_CARE_PROVIDER_SITE_OTHER): Payer: BC Managed Care – PPO | Admitting: Physician Assistant

## 2019-10-01 ENCOUNTER — Other Ambulatory Visit: Payer: Self-pay

## 2019-10-01 ENCOUNTER — Encounter: Payer: Self-pay | Admitting: Physician Assistant

## 2019-10-01 DIAGNOSIS — F4001 Agoraphobia with panic disorder: Secondary | ICD-10-CM

## 2019-10-01 DIAGNOSIS — F411 Generalized anxiety disorder: Secondary | ICD-10-CM | POA: Diagnosis not present

## 2019-10-01 NOTE — Progress Notes (Signed)
[facility]  Patient ID: Stacy Peck,  MRN: [id number]  PCP: Patient, No Pcp Per  Date of Evaluation: 10/01/2019 Time spent:20 minutes  Chief Complaint:  Chief Complaint    Anxiety      HISTORY/CURRENT STATUS: HPI for routine med check.  Patient presents for medication refill.  She has chronic generalized anxiety that is well treated with her current regimen of medications.  She occasionally has panic attacks or extreme racing thoughts that will not stop and she will use the Xanax.  She does not take it daily.  For the most part, she is having situational anxiety that triggers the need for Xanax, especially being around a lot of people.  Patient also uses deep breathing or other techniques she has learned in therapy, which are helpful.  She and her family are doing well.  They have a lot of things going on this summer.  Her mother-in-law is getting remarried plus there are a couple of other weddings that she will be a part of.  So it will be a busy summer.   She sleeps well most of the time.  He is able to enjoy things.  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  Energy and motivation are good.  She does not isolate.  No SI/HI.  Individual Medical History/ Review of Systems: Changes? :No    Past medications for mental health diagnoses include: Zoloft caused nausea, Prozac caused acne questionably, BuSpar, Luvox caused insomnia  Allergies: Patient has no known allergies.  Current Medications:  Current Outpatient Medications:  .  ALPRAZolam (XANAX) 0.5 MG tablet, Take 1 tablet (0.5 mg total) by mouth at bedtime as needed for anxiety., Disp: 30 tablet, Rfl: 1 .  busPIRone (BUSPAR) 15 MG tablet, Take 1 tablet (15 mg total) by mouth 2 (two) times daily., Disp: 180 tablet, Rfl: 0 .  levothyroxine (SYNTHROID, LEVOTHROID) 75 MCG tablet, Take 75 mcg by mouth daily.  , Disp: , Rfl:  .  Multiple Vitamin (MULTIVITAMIN) tablet,  Take 1 tablet by mouth daily., Disp: , Rfl:  .  spironolactone (ALDACTONE) 100 MG tablet, Take 100 mg by mouth daily., Disp: , Rfl:  .  TRI-PREVIFEM 0.18/0.215/0.25 MG-35 MCG tablet, Take 1 tablet by mouth daily., Disp: , Rfl:  Medication Side Effects: none  Family Medical/ Social History: Changes? No  MENTAL HEALTH EXAM:  unknown if currently breastfeeding.There is no height or weight on file to calculate BMI.  General Appearance: Casual, Neat and Well Groomed  Eye Contact:  Good  Speech:  Clear and Coherent and Normal Rate  Volume:  Normal  Mood:  Euthymic  Affect:  Appropriate  Thought Process:  Goal Directed and Descriptions of Associations: Intact  Orientation:  Full (Time, Place, and Person)  Thought Content: Logical   Suicidal Thoughts:  No  Homicidal Thoughts:  No  Memory:  WNL  Judgement:  Good  Insight:  Good  Psychomotor Activity:  Normal  Concentration:  Concentration: Good  Recall:  Good  Fund of Knowledge: Good  Language: Good  Assets:  Desire for Improvement  ADL's:  Intact  Cognition: WNL  Prognosis:  Good    DIAGNOSES:    ICD-10-CM   1. Generalized anxiety disorder  F41.1   2. Panic disorder with agoraphobia  F40.01     Receiving Psychotherapy: No    RECOMMENDATIONS:  PDMP was reviewed. I spent 20 minutes with her. Continue BuSpar 15 mg, 1  p.o. twice daily. Continue Xanax 0.5 mg, 1 p.o. daily as needed. Return in 6 months.  Donnal Moat, PA-C

## 2020-03-20 DIAGNOSIS — D485 Neoplasm of uncertain behavior of skin: Secondary | ICD-10-CM | POA: Diagnosis not present

## 2020-03-20 DIAGNOSIS — A630 Anogenital (venereal) warts: Secondary | ICD-10-CM | POA: Diagnosis not present

## 2020-03-20 DIAGNOSIS — D2230 Melanocytic nevi of unspecified part of face: Secondary | ICD-10-CM | POA: Diagnosis not present

## 2020-03-20 DIAGNOSIS — L821 Other seborrheic keratosis: Secondary | ICD-10-CM | POA: Diagnosis not present

## 2020-03-20 DIAGNOSIS — L578 Other skin changes due to chronic exposure to nonionizing radiation: Secondary | ICD-10-CM | POA: Diagnosis not present

## 2020-04-01 ENCOUNTER — Ambulatory Visit: Payer: BC Managed Care – PPO | Admitting: Physician Assistant

## 2020-04-08 DIAGNOSIS — R1084 Generalized abdominal pain: Secondary | ICD-10-CM | POA: Diagnosis not present

## 2020-04-09 ENCOUNTER — Encounter: Payer: Self-pay | Admitting: Physician Assistant

## 2020-04-09 ENCOUNTER — Ambulatory Visit (INDEPENDENT_AMBULATORY_CARE_PROVIDER_SITE_OTHER): Payer: BC Managed Care – PPO | Admitting: Physician Assistant

## 2020-04-09 ENCOUNTER — Other Ambulatory Visit: Payer: Self-pay

## 2020-04-09 DIAGNOSIS — F4010 Social phobia, unspecified: Secondary | ICD-10-CM

## 2020-04-09 DIAGNOSIS — F411 Generalized anxiety disorder: Secondary | ICD-10-CM

## 2020-04-09 MED ORDER — BUSPIRONE HCL 15 MG PO TABS: ORAL_TABLET | ORAL | 1 refills | Status: DC

## 2020-04-09 MED ORDER — ALPRAZOLAM 0.5 MG PO TABS: 0.5000 mg | ORAL_TABLET | Freq: Two times a day (BID) | ORAL | 1 refills | Status: DC | PRN

## 2020-04-09 NOTE — Progress Notes (Signed)
[facility]  Patient ID: Stacy Peck,  MRN: [id number]  PCP: Patient, No Pcp Per  Date of Evaluation: 04/09/2020 Time spent:20 minutes  Chief Complaint:  Chief Complaint    Anxiety; Follow-up      HISTORY/CURRENT STATUS: HPI for routine med check.  Here for medication refill. Is having more anxiety lately. Has had problems with abdominal hernia, saw Dr.yesterday and they are getting a CT. that causes anxiety.  She occasionally has panic attacks or extreme racing thoughts that will not stop and she will use the Xanax.  She still has situational anxiety, usually brought on by being in crowds or with people she does not know.  The Xanax is still helpful.  She and her family are doing well. She is able to enjoy things.  She continues to work out a lot. Energy and motivation are good.  She does not isolate.  No SI/HI.  Patient denies increased energy with decreased need for sleep, no increased talkativeness, no racing thoughts, no impulsivity or risky behaviors, no increased spending, no increased libido, no grandiosity, no increased irritability or anger, and no hallucinations.  Denies dizziness, syncope, seizures, numbness, tingling, tremor, tics, unsteady gait, slurred speech, confusion. Denies muscle or joint pain, stiffness, or dystonia.  Individual Medical History/ Review of Systems: Changes? :No    Past medications for mental health diagnoses include: Zoloft caused nausea, Prozac caused acne questionably, BuSpar, Luvox caused insomnia  Allergies: Patient has no known allergies.  Current Medications:  Current Outpatient Medications:  .  ALPRAZolam (XANAX) 0.5 MG tablet, Take 1 tablet (0.5 mg total) by mouth 2 (two) times daily as needed for anxiety., Disp: 60 tablet, Rfl: 1 .  busPIRone (BUSPAR) 15 MG tablet, 1/2 po q am, 1/2 at lunch, and 1.5 po qhs., Disp: 225 tablet, Rfl: 1 .  levothyroxine (SYNTHROID, LEVOTHROID) 75 MCG tablet, Take 75 mcg by mouth daily.  ,  Disp: , Rfl:  .  Multiple Vitamin (MULTIVITAMIN) tablet, Take 1 tablet by mouth daily., Disp: , Rfl:  .  spironolactone (ALDACTONE) 100 MG tablet, Take 100 mg by mouth daily., Disp: , Rfl:  .  TRI-PREVIFEM 0.18/0.215/0.25 MG-35 MCG tablet, Take 1 tablet by mouth daily., Disp: , Rfl:  Medication Side Effects: none  Family Medical/ Social History: Changes? No  MENTAL HEALTH EXAM:  unknown if currently breastfeeding.There is no height or weight on file to calculate BMI.  General Appearance: Casual, Neat and Well Groomed  Eye Contact:  Good  Speech:  Clear and Coherent and Normal Rate  Volume:  Normal  Mood:  Euthymic  Affect:  Appropriate  Thought Process:  Goal Directed and Descriptions of Associations: Intact  Orientation:  Full (Time, Place, and Person)  Thought Content: Logical   Suicidal Thoughts:  No  Homicidal Thoughts:  No  Memory:  WNL  Judgement:  Good  Insight:  Good  Psychomotor Activity:  Normal  Concentration:  Concentration: Good  Recall:  Good  Fund of Knowledge: Good  Language: Good  Assets:  Desire for Improvement  ADL's:  Intact  Cognition: WNL  Prognosis:  Good    DIAGNOSES:    ICD-10-CM   1. Generalized anxiety disorder  F41.1   2. Social anxiety disorder  F40.10     Receiving Psychotherapy: No    RECOMMENDATIONS:  PDMP was reviewed. I provided 20 minutes of face-to-face time during this encounter. We discussed increasing the BuSpar.  It can make her drowsy so we will space the dose out over the  course of the day. Increase BuSpar 15 mg to 1/2 pill every morning, 1/2 pill at lunch, and 1.5 pills in the evening. Continue Xanax 0.5 mg, 1 p.o. daily as needed. Return in 4 to 6 weeks.  Donnal Moat, PA-C

## 2020-04-10 ENCOUNTER — Other Ambulatory Visit: Payer: Self-pay | Admitting: Family Medicine

## 2020-04-10 DIAGNOSIS — K469 Unspecified abdominal hernia without obstruction or gangrene: Secondary | ICD-10-CM | POA: Diagnosis not present

## 2020-04-30 ENCOUNTER — Other Ambulatory Visit: Payer: Self-pay

## 2020-06-03 ENCOUNTER — Ambulatory Visit: Payer: BC Managed Care – PPO | Admitting: Physician Assistant

## 2020-06-04 ENCOUNTER — Other Ambulatory Visit: Payer: Self-pay | Admitting: Family Medicine

## 2020-06-04 ENCOUNTER — Ambulatory Visit
Admission: RE | Admit: 2020-06-04 | Discharge: 2020-06-04 | Disposition: A | Discharge status: Home / Self Care | Payer: BC Managed Care – PPO | Source: Ambulatory Visit | Attending: Family Medicine | Admitting: Family Medicine

## 2020-06-04 ENCOUNTER — Other Ambulatory Visit: Payer: Self-pay

## 2020-06-04 DIAGNOSIS — N200 Calculus of kidney: Secondary | ICD-10-CM | POA: Diagnosis not present

## 2020-06-04 DIAGNOSIS — K469 Unspecified abdominal hernia without obstruction or gangrene: Secondary | ICD-10-CM

## 2020-06-06 ENCOUNTER — Other Ambulatory Visit: Payer: Self-pay | Admitting: Family Medicine

## 2020-06-06 DIAGNOSIS — K469 Unspecified abdominal hernia without obstruction or gangrene: Secondary | ICD-10-CM

## 2020-06-23 DIAGNOSIS — E039 Hypothyroidism, unspecified: Secondary | ICD-10-CM | POA: Diagnosis not present

## 2020-06-30 DIAGNOSIS — E039 Hypothyroidism, unspecified: Secondary | ICD-10-CM | POA: Diagnosis not present

## 2020-07-17 ENCOUNTER — Other Ambulatory Visit: Payer: Self-pay

## 2020-07-17 ENCOUNTER — Encounter: Payer: Self-pay | Admitting: Physician Assistant

## 2020-07-17 ENCOUNTER — Ambulatory Visit (INDEPENDENT_AMBULATORY_CARE_PROVIDER_SITE_OTHER): Payer: BC Managed Care – PPO | Admitting: Physician Assistant

## 2020-07-17 DIAGNOSIS — F411 Generalized anxiety disorder: Secondary | ICD-10-CM

## 2020-07-17 DIAGNOSIS — R42 Dizziness and giddiness: Secondary | ICD-10-CM | POA: Diagnosis not present

## 2020-07-17 DIAGNOSIS — F3289 Other specified depressive episodes: Secondary | ICD-10-CM | POA: Diagnosis not present

## 2020-07-17 DIAGNOSIS — F410 Panic disorder [episodic paroxysmal anxiety] without agoraphobia: Secondary | ICD-10-CM | POA: Diagnosis not present

## 2020-07-17 DIAGNOSIS — H938X1 Other specified disorders of right ear: Secondary | ICD-10-CM | POA: Diagnosis not present

## 2020-07-17 MED ORDER — ALPRAZOLAM 0.5 MG PO TABS: 0.5000 mg | ORAL_TABLET | Freq: Two times a day (BID) | ORAL | 5 refills | Status: DC | PRN

## 2020-07-17 MED ORDER — ESCITALOPRAM OXALATE 5 MG PO TABS: 5.0000 mg | ORAL_TABLET | Freq: Every day | ORAL | 1 refills | Status: DC

## 2020-07-17 NOTE — Progress Notes (Signed)
[facility]  Patient ID: Stacy Peck,  MRN: [id number]  PCP: Patient, No Pcp Per  Date of Evaluation: 07/17/2020 Time spent:30 minutes  Chief Complaint:  Chief Complaint    Anxiety      HISTORY/CURRENT STATUS: HPI for routine med check.  Has been struggling a lot. Consumed by worry over physical health probs, she cancelled the CT that was planned b/c of worry, then r/s it but freaked out when she found out she was going to need iodine, and she reused the iodine. Now she's worried that something was missed b/c of no iodine. (was looking for a hernia, all was normal.) Is in a heightened state of awareness, all the time. Has PA too, several times a month, twice she felt like she was going to pass out. Chest will get tight and has SOB when she gets like that. Panic attacks usually only last about 10 minutes or so but then she feels weak and is exhausted afterwards. Sometimes can last 30 minutes or more.  Anxiety is pretty crippling for the past 2-3 months. She continues to work out but not as often. Hasn't been motivated or have the energy to go to the gym. "Going through the motions" with things at home. Able to get things done in the house but that's it. Isolating from gym, and tried to get out of a trip to [place], which isn't like her. Cries easily. Not sleeping well at all. Trouble falling asleep and staying asleep. Xanax helps, but she doesn't like taking it. Only gets about 5 hours without the Xanax. No SI/HI.  Patient denies increased energy with decreased need for sleep, no increased talkativeness, no racing thoughts, no impulsivity or risky behaviors, no increased spending, no increased libido, no grandiosity, no increased irritability or anger, and no hallucinations.  Denies dizziness, syncope, seizures, numbness, tingling, tremor, tics, unsteady gait, slurred speech, confusion. Denies muscle or joint pain, stiffness, or dystonia.  Individual Medical History/ Review of  Systems: Changes? :No    Past medications for mental health diagnoses include: Zoloft caused nausea, Prozac caused acne questionably, BuSpar, Luvox caused insomnia  Allergies: Patient has no known allergies.  Current Medications:  Current Outpatient Medications:  .  busPIRone (BUSPAR) 15 MG tablet, 1/2 po q am, 1/2 at lunch, and 1.5 po qhs., Disp: 225 tablet, Rfl: 1 .  escitalopram (LEXAPRO) 5 MG tablet, Take 1 tablet (5 mg total) by mouth daily., Disp: 30 tablet, Rfl: 1 .  levothyroxine (SYNTHROID, LEVOTHROID) 75 MCG tablet, Take 75 mcg by mouth daily., Disp: , Rfl:  .  Multiple Vitamin (MULTIVITAMIN) tablet, Take 1 tablet by mouth daily., Disp: , Rfl:  .  spironolactone (ALDACTONE) 100 MG tablet, Take 100 mg by mouth daily., Disp: , Rfl:  .  TRI-PREVIFEM 0.18/0.215/0.25 MG-35 MCG tablet, Take 1 tablet by mouth daily., Disp: , Rfl:  .  ALPRAZolam (XANAX) 0.5 MG tablet, Take 1 tablet (0.5 mg total) by mouth 2 (two) times daily as needed for anxiety., Disp: 60 tablet, Rfl: 5 Medication Side Effects: none  Family Medical/ Social History: Changes? No  MENTAL HEALTH EXAM:  unknown if currently breastfeeding.There is no height or weight on file to calculate BMI.  General Appearance: Casual, Neat and Well Groomed  Eye Contact:  Good  Speech:  Clear and Coherent and Normal Rate  Volume:  Normal  Mood:  Anxious and Depressed  Affect:  Depressed, Tearful and Anxious  Thought Process:  Goal Directed and Descriptions of Associations: Intact  Orientation:  Full (Time, Place, and Person)  Thought Content: Logical   Suicidal Thoughts:  No  Homicidal Thoughts:  No  Memory:  WNL  Judgement:  Good  Insight:  Good  Psychomotor Activity:  Normal  Concentration:  Concentration: Good  Recall:  Good  Fund of Knowledge: Good  Language: Good  Assets:  Desire for Improvement  ADL's:  Intact  Cognition: WNL  Prognosis:  Good    DIAGNOSES:    ICD-10-CM   1. Generalized anxiety disorder  F41.1    2. Other depression  F32.89   3. Panic attacks  F41.0     Receiving Psychotherapy: No    RECOMMENDATIONS:  PDMP was reviewed. I provided 30 minutes of face-to-face time during this encounter in which we discussed the worsening anxiety and different treatment options. Recommend starting an antidepressant because it will help with anxiety as well. I believe that anxiety is causing the depression and SSRIs are a good treatment in that situation. She has tried several SSRIs in the past but had side effects, as noted above. We could go straight to an SNRI but in my experience the SSRI drugs help more with anxiety. I would recommend Lexapro, it does not seem to cause weight gain as she is concerned about. She would like to try it. Benefits and risk were discussed. She understands I am starting her at a very low dose and not to get discouraged if she is not improving but it will take 4 to 6 weeks most likely for her to see that benefit. Start Lexapro 5 mg, one p.o. daily. Continue BuSpar 15 mg, 1/2 pill every morning, 1/2 pill at lunch, and 1.5 pills in the evening. Continue Xanax 0.5 mg, 1 p.o. daily as needed. Recommend counseling. Return in 4 to 6 weeks.  Donnal Moat, PA-C

## 2020-07-21 DIAGNOSIS — F411 Generalized anxiety disorder: Secondary | ICD-10-CM | POA: Insufficient documentation

## 2020-07-21 DIAGNOSIS — F410 Panic disorder [episodic paroxysmal anxiety] without agoraphobia: Secondary | ICD-10-CM | POA: Insufficient documentation

## 2020-07-22 DIAGNOSIS — H6981 Other specified disorders of Eustachian tube, right ear: Secondary | ICD-10-CM | POA: Diagnosis not present

## 2020-09-02 DIAGNOSIS — L700 Acne vulgaris: Secondary | ICD-10-CM | POA: Diagnosis not present

## 2020-09-09 ENCOUNTER — Other Ambulatory Visit: Payer: Self-pay

## 2020-09-09 ENCOUNTER — Ambulatory Visit (INDEPENDENT_AMBULATORY_CARE_PROVIDER_SITE_OTHER): Payer: BC Managed Care – PPO | Admitting: Physician Assistant

## 2020-09-09 ENCOUNTER — Encounter: Payer: Self-pay | Admitting: Physician Assistant

## 2020-09-09 DIAGNOSIS — F411 Generalized anxiety disorder: Secondary | ICD-10-CM | POA: Diagnosis not present

## 2020-09-09 DIAGNOSIS — F4010 Social phobia, unspecified: Secondary | ICD-10-CM

## 2020-09-09 DIAGNOSIS — F3289 Other specified depressive episodes: Secondary | ICD-10-CM | POA: Diagnosis not present

## 2020-09-09 MED ORDER — BUSPIRONE HCL 15 MG PO TABS: ORAL_TABLET | ORAL | 1 refills | Status: DC

## 2020-09-09 NOTE — Patient Instructions (Signed)
On the Lexapro 5 mg, take 1/2 pill daily for 1 week and then if tolerating well, go on up to 1 pill daily.

## 2020-09-09 NOTE — Progress Notes (Signed)
[facility]  Patient ID: Stacy Peck,  MRN: [id number]  PCP: Patient, No Pcp Per (Inactive)  Date of Evaluation: 09/09/2020 Time spent:20 minutes  Chief Complaint:  Chief Complaint    Anxiety; Follow-up      HISTORY/CURRENT STATUS: HPI For routine med check.   Didn't start the Lexapro. Afraid of the SE. But knows she needs to. Doesn't like having to take meds, but anxiety is still bad, more a sense of unease all the time, like something bad is going to happen. Not having PA often but many times a week a week she feels on the verge of one. Xanax helps but doesn't want to have to depend on it. Buspar is helping, at least some.   Not really depressed, but states she gets sad when she feel so anxious that it affects her family time. Is busy with her kids and husband, but has to push herself sometimes. Continues to work out and enjoys that. Not isolating but prefers to be home if she can. Has racing thoughts often in the evenings. Xanax helps her mind slow down so she can go to sleep. No SI/HI.  Denies dizziness, syncope, seizures, numbness, tingling, tremor, tics, unsteady gait, slurred speech, confusion. Denies muscle or joint pain, stiffness, or dystonia.  Individual Medical History/ Review of Systems: Changes? :No    Past medications for mental health diagnoses include: Zoloft caused nausea, Prozac caused acne questionably, BuSpar, Luvox caused insomnia  Allergies: Patient has no known allergies.  Current Medications:  Current Outpatient Medications:  .  ALPRAZolam (XANAX) 0.5 MG tablet, Take 1 tablet (0.5 mg total) by mouth 2 (two) times daily as needed for anxiety., Disp: 60 tablet, Rfl: 5 .  levothyroxine (SYNTHROID, LEVOTHROID) 75 MCG tablet, Take 75 mcg by mouth daily., Disp: , Rfl:  .  Multiple Vitamin (MULTIVITAMIN) tablet, Take 1 tablet by mouth daily., Disp: , Rfl:  .  spironolactone (ALDACTONE) 100 MG tablet, Take 100 mg by mouth daily., Disp: , Rfl:  .   TRI-PREVIFEM 0.18/0.215/0.25 MG-35 MCG tablet, Take 1 tablet by mouth daily., Disp: , Rfl:  .  busPIRone (BUSPAR) 15 MG tablet, 1/2 po q am, 1/2 at lunch, and 1.5 po qhs., Disp: 225 tablet, Rfl: 1 .  escitalopram (LEXAPRO) 5 MG tablet, Take 1 tablet (5 mg total) by mouth daily. (Patient not taking: Reported on 09/09/2020), Disp: 30 tablet, Rfl: 1 Medication Side Effects: none  Family Medical/ Social History: Changes? No  MENTAL HEALTH EXAM:  unknown if currently breastfeeding.There is no height or weight on file to calculate BMI.  General Appearance: Casual and Well Groomed  Eye Contact:  Good  Speech:  Clear and Coherent and Normal Rate  Volume:  Normal  Mood:  Anxious  Affect:  Anxious  Thought Process:  Goal Directed  Orientation:  Full (Time, Place, and Person)  Thought Content: Logical   Suicidal Thoughts:  No  Homicidal Thoughts:  No  Memory:  WNL  Judgement:  Good  Insight:  Good  Psychomotor Activity:  Normal  Concentration:  Concentration: Good  Recall:  Good  Fund of Knowledge: Good  Language: Good  Assets:  Desire for Improvement Financial Resources/Insurance [place] Talents/Skills Transportation Vocational/Educational  ADL's:  Intact  Cognition: WNL  Prognosis:  Good    DIAGNOSES:    ICD-10-CM   1. Generalized anxiety disorder  F41.1   2. Social anxiety disorder  F40.10   3. Other depression  F32.89  Receiving Psychotherapy: No    RECOMMENDATIONS:  PDMP was reviewed. I provided 20 mins of face to face time during this encounter, including time spent before and after the visit in records review and charting. Again discussed the fact that antidepressants are also used to treat and prevent anxiety. Benefits, risks, and SE were discussed and pt states she's willing to start it now. We agree to start at very low dose and gradually go up to therapeutic level as she tolerates it.  Start Lexapro 5 mg, 1/2 pill  daily for 1 week, then if tolerating well, increase to 1 po qd. Continue Xanax 0.5 mg, 1 bid prn. Cont Buspar 15 mg, 1/2 in morning, 1/2 at lunch, and 1.5 pills in evening.  Recommend therapy. Return in 2 months.   Donnal Moat, PA-C

## 2020-11-13 ENCOUNTER — Ambulatory Visit: Payer: BC Managed Care – PPO | Admitting: Physician Assistant

## 2020-11-13 ENCOUNTER — Other Ambulatory Visit: Payer: Self-pay

## 2020-11-13 ENCOUNTER — Encounter: Payer: Self-pay | Admitting: Physician Assistant

## 2020-11-13 DIAGNOSIS — F410 Panic disorder [episodic paroxysmal anxiety] without agoraphobia: Secondary | ICD-10-CM | POA: Diagnosis not present

## 2020-11-13 DIAGNOSIS — F3289 Other specified depressive episodes: Secondary | ICD-10-CM | POA: Diagnosis not present

## 2020-11-13 DIAGNOSIS — F4010 Social phobia, unspecified: Secondary | ICD-10-CM

## 2020-11-13 MED ORDER — ESCITALOPRAM OXALATE 10 MG PO TABS: 10.0000 mg | ORAL_TABLET | Freq: Every day | ORAL | 1 refills | Status: DC

## 2020-11-13 NOTE — Progress Notes (Signed)
[facility]  Patient ID: Stacy Peck,  MRN: [id number]  PCP: Patient, No Pcp Per (Inactive)  Date of Evaluation: 11/13/2020  time spent:20 minutes  Chief Complaint:  Chief Complaint   Anxiety; Follow-up      HISTORY/CURRENT STATUS: HPI For routine med check.   Started Lexapro 2 months ago. Feels a little bit better. Still has anxiety, especially worrying about her kids, afraid she might miss something with their health.  Thinks some of that stems back to when she was really sick as a child and was misdiagnosed.  Since starting Lexapro she does not get in a loop of worry quite as bad as she did.  Panic attacks are not as often or as severe.  Patient denies loss of interest in usual activities and is able to enjoy things.  At present she is volunteering at vacation [employer] at [employer]  Denies decreased energy or motivation.  Appetite has not changed.  No extreme sadness, tearfulness, or feelings of hopelessness.  Denies any changes in concentration, making decisions or remembering things.  Denies suicidal or homicidal thoughts.  Denies dizziness, syncope, seizures, numbness, tingling, tremor, tics, unsteady gait, slurred speech, confusion. Denies muscle or joint pain, stiffness, or dystonia.  Individual Medical History/ Review of Systems: Changes? :No    Past medications for mental health diagnoses include: Zoloft caused nausea, Prozac caused acne questionably, BuSpar, Luvox caused insomnia  Allergies: Patient has no known allergies.  Current Medications:  Current Outpatient Medications:    ALPRAZolam (XANAX) 0.5 MG tablet, Take 1 tablet (0.5 mg total) by mouth 2 (two) times daily as needed for anxiety., Disp: 60 tablet, Rfl: 5   busPIRone (BUSPAR) 15 MG tablet, 1/2 po q am, 1/2 at lunch, and 1.5 po qhs., Disp: 225 tablet, Rfl: 1   escitalopram (LEXAPRO) 10 MG tablet, Take 1 tablet (10 mg total) by mouth daily., Disp: 30 tablet, Rfl: 1   levothyroxine  (SYNTHROID, LEVOTHROID) 75 MCG tablet, Take 75 mcg by mouth daily., Disp: , Rfl:    Multiple Vitamin (MULTIVITAMIN) tablet, Take 1 tablet by mouth daily., Disp: , Rfl:    spironolactone (ALDACTONE) 100 MG tablet, Take 100 mg by mouth daily., Disp: , Rfl:    TRI-PREVIFEM 0.18/0.215/0.25 MG-35 MCG tablet, Take 1 tablet by mouth daily., Disp: , Rfl:  Medication Side Effects: none  Family Medical/ Social History: Changes? No  MENTAL HEALTH EXAM:  unknown if currently breastfeeding.There is no height or weight on file to calculate BMI.  General Appearance: Casual and Well Groomed  Eye Contact:  Good  Speech:  Clear and Coherent and Normal Rate  Volume:  Normal  Mood:  Anxious  Affect:  Anxious  Thought Process:  Goal Directed  Orientation:  Full (Time, Place, and Person)  Thought Content: Logical   Suicidal Thoughts:  No  Homicidal Thoughts:  No  Memory:  WNL  Judgement:  Good  Insight:  Good  Psychomotor Activity:  Normal  Concentration:  Concentration: Good  Recall:  Good  Fund of Knowledge: Good  Language: Good  Assets:  Desire for Improvement Financial Resources/Insurance [place] Talents/Skills Transportation Vocational/Educational  ADL's:  Intact  Cognition: WNL  Prognosis:  Good    DIAGNOSES:    ICD-10-CM   1. Social anxiety disorder  F40.10     2. Panic attacks  F41.0     3. Other depression  F32.89        Receiving Psychotherapy: No    RECOMMENDATIONS:  PDMP was reviewed. I provided 20 minutes of face to face time during this encounter, including time spent before and after the visit in records review, medical decision making, counseling, and charting.  I'm glad to see she's improving, but increasing the dose will help even more.  She would like to try it.  Because she is petite we will increase gradually.   Increase Lexapro 5 mg, to 1.5 pill for 2 weeks, then increase to 10 mg.  Continue Xanax 0.5 mg, 1 bid  prn. Cont Buspar 15 mg, 1/2 in morning, 1/2 at lunch, and 1.5 pills in evening.  Recommend therapy. Return in 2 months.   Donnal Moat, PA-C

## 2020-11-27 DIAGNOSIS — Z01419 Encounter for gynecological examination (general) (routine) without abnormal findings: Secondary | ICD-10-CM | POA: Diagnosis not present

## 2020-11-27 DIAGNOSIS — Z1231 Encounter for screening mammogram for malignant neoplasm of breast: Secondary | ICD-10-CM | POA: Diagnosis not present

## 2020-11-27 DIAGNOSIS — Z681 Body mass index (BMI) 19 or less, adult: Secondary | ICD-10-CM | POA: Diagnosis not present

## 2020-12-02 DIAGNOSIS — E039 Hypothyroidism, unspecified: Secondary | ICD-10-CM | POA: Diagnosis not present

## 2020-12-02 DIAGNOSIS — Z13220 Encounter for screening for lipoid disorders: Secondary | ICD-10-CM | POA: Diagnosis not present

## 2020-12-02 DIAGNOSIS — F418 Other specified anxiety disorders: Secondary | ICD-10-CM | POA: Diagnosis not present

## 2021-01-10 IMAGING — CT CT ABD-PELV W/O CM
1 of 2 series · 14 of 32 positions shown, 19 images · non-contrast
Comparison: None.

CLINICAL DATA: Palpable abnormality. Suspected ventral hernia.
Previous history of diaphragmatic hernia repair.

EXAM:
CT ABDOMEN AND PELVIS WITHOUT CONTRAST
TECHNIQUE: Multidetector CT imaging of the abdomen and pelvis was performed
following the standard protocol without IV contrast.

[Series 2: abd/pelvis w/(date) · axial · 0.63mm/px · z∈[-423,-78]mm · 14 of 79 slices shown, 19 images]
[im 5/79  soft-tissue]
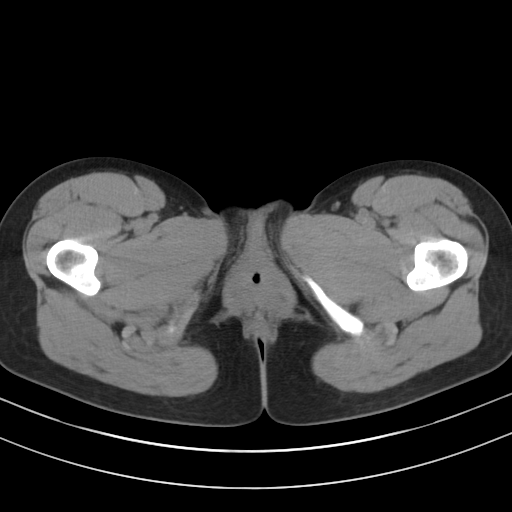
[im 5/79  bone]
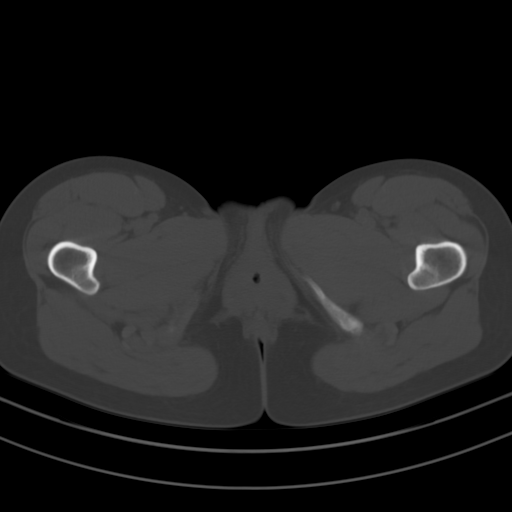
[im 9/79  soft-tissue]
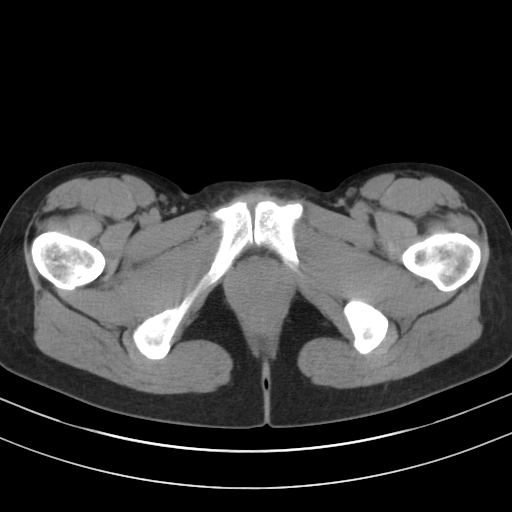
[im 18/79  soft-tissue]
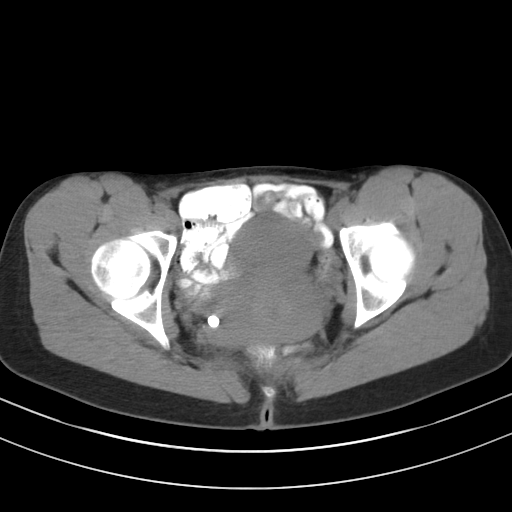
[im 22/79  soft-tissue]
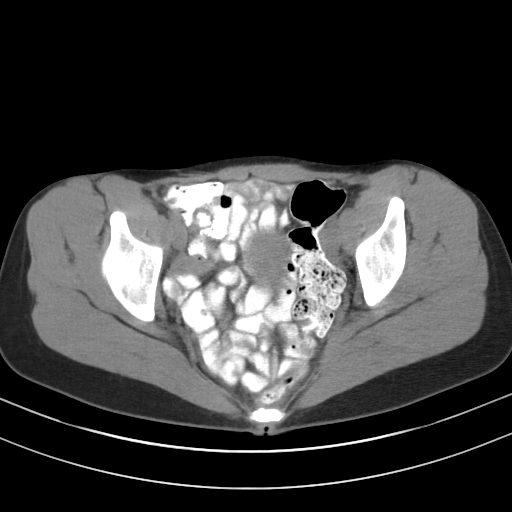
[im 27/79  soft-tissue]
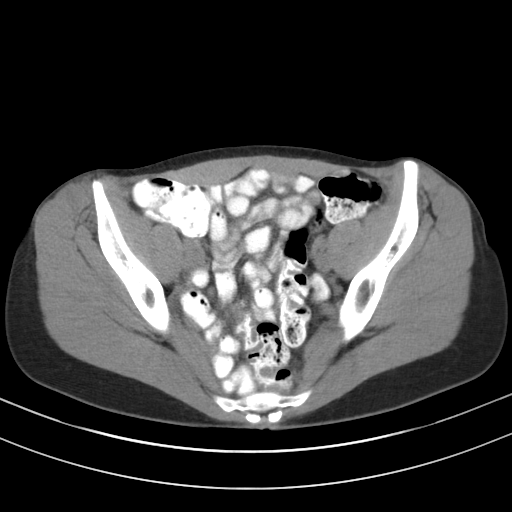
[im 35/79  soft-tissue]
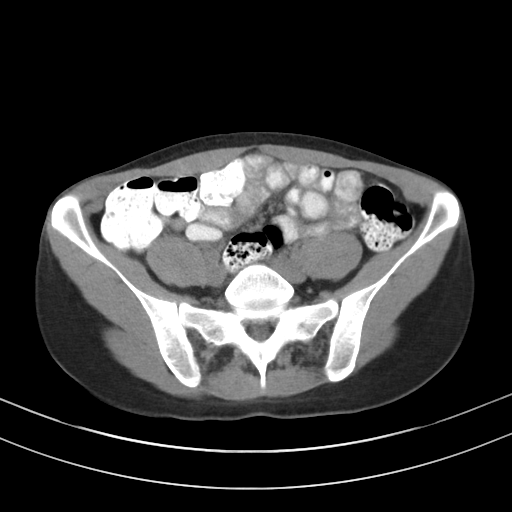
[im 40/79  soft-tissue]
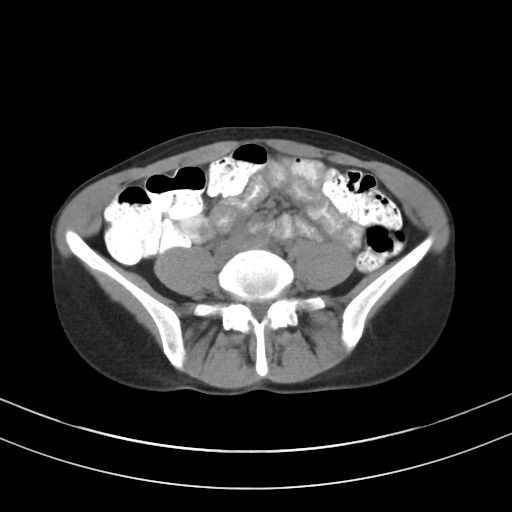
[im 44/79  soft-tissue]
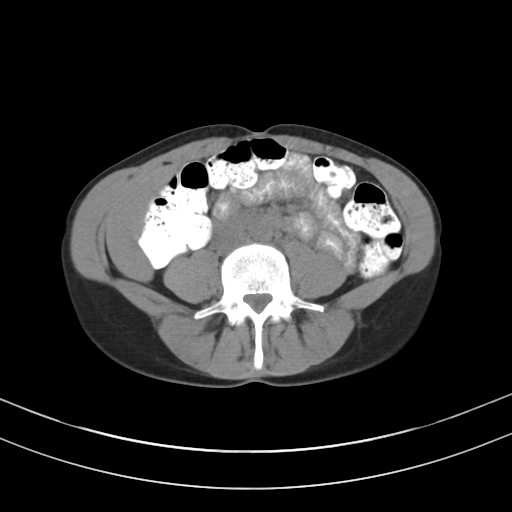
[im 53/79  soft-tissue]
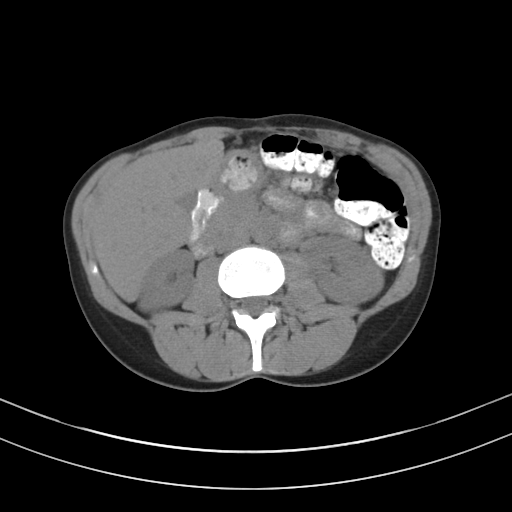
[im 53/79  bone]
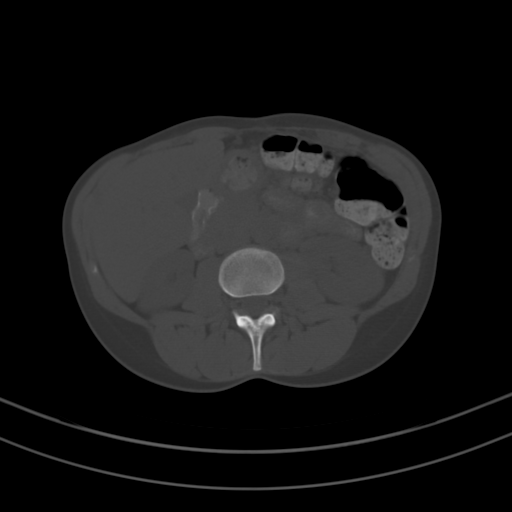
[im 57/79  soft-tissue]
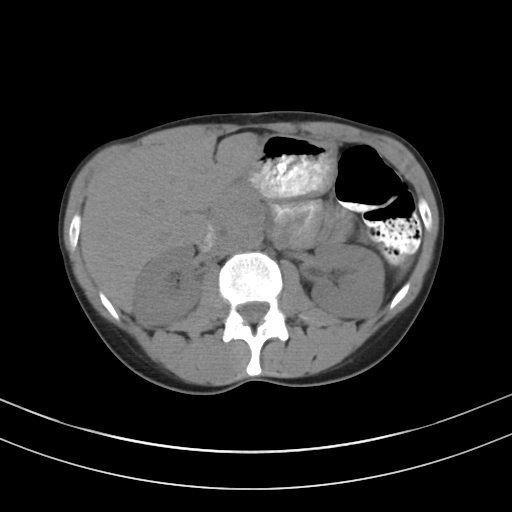
[im 61/79  soft-tissue]
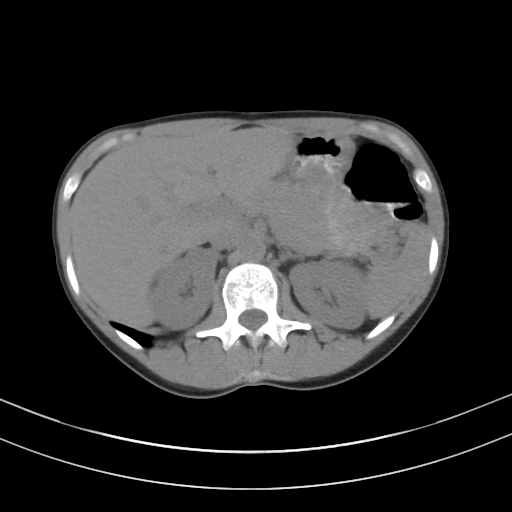
[im 61/79  lung]
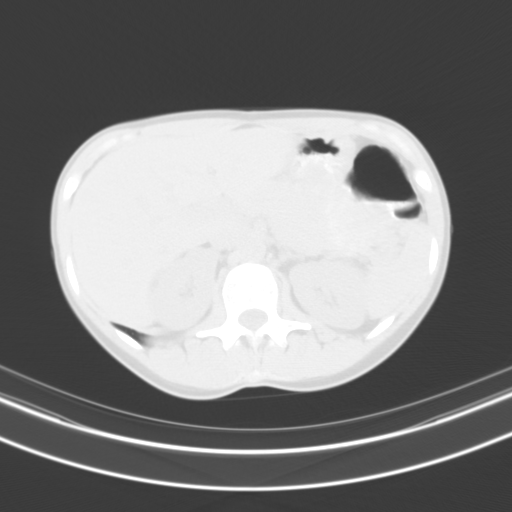
[im 66/79  lung]
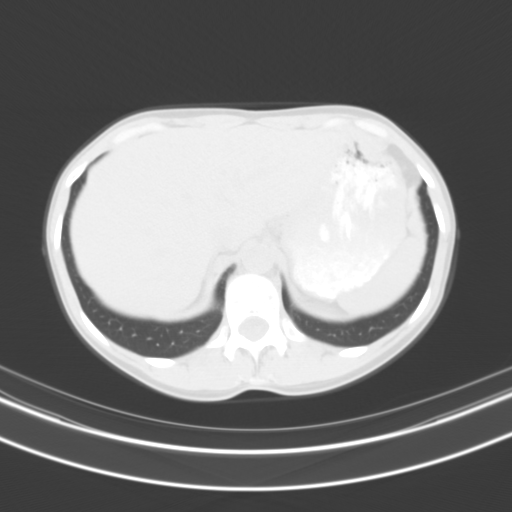
[im 70/79  soft-tissue]
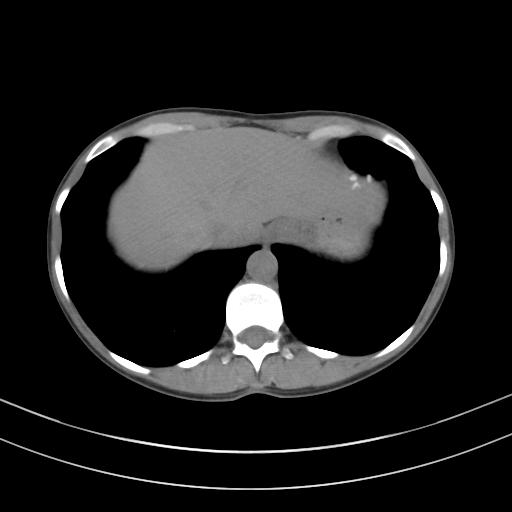
[im 70/79  lung]
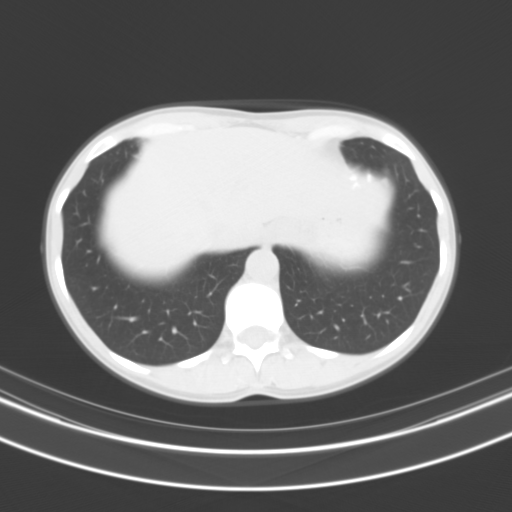
[im 74/79  soft-tissue]
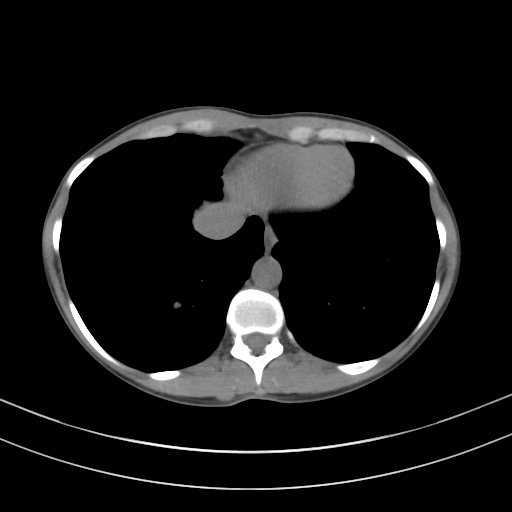
[im 74/79  lung]
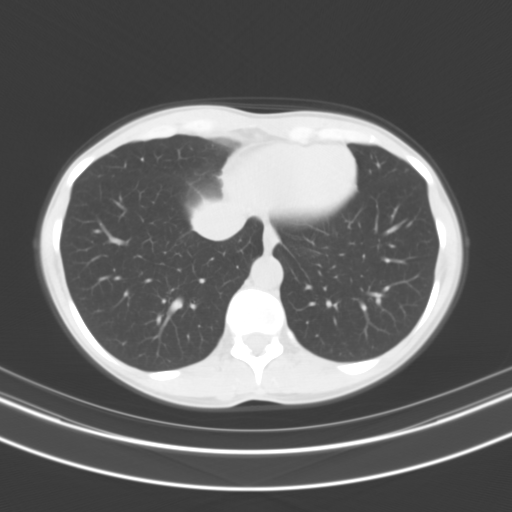

[14 of 32 positions shown; findings below may reference images not displayed]

FINDINGS: Lower chest: No acute findings. No evidence of diaphragmatic hernia.

Hepatobiliary: No mass visualized on this unenhanced exam.
Gallbladder is unremarkable. No evidence of biliary ductal
dilatation.

Pancreas: No mass or inflammatory process visualized on this
unenhanced exam.

Spleen:  Within normal limits in size.

Adrenals/Urinary tract: 2 mm calculus noted in lower pole of right
kidney. No evidence of ureteral calculi or hydronephrosis.

Stomach/Bowel: No evidence of obstruction, inflammatory process, or
abnormal fluid collections.

Vascular/Lymphatic: No pathologically enlarged lymph nodes
identified. No evidence of abdominal aortic aneurysm.

Reproductive:  No mass or other significant abnormality.

Other:  No evidence of ventral abdominal wall hernia or mass.

Musculoskeletal:  No suspicious bone lesions identified.
IMPRESSION: No evidence of hernia, mass, or other acute findings.

Tiny nonobstructing right renal calculus.

## 2021-01-27 ENCOUNTER — Other Ambulatory Visit: Payer: Self-pay

## 2021-01-27 ENCOUNTER — Encounter: Payer: Self-pay | Admitting: Physician Assistant

## 2021-01-27 ENCOUNTER — Ambulatory Visit: Payer: BC Managed Care – PPO | Admitting: Physician Assistant

## 2021-01-27 DIAGNOSIS — F4010 Social phobia, unspecified: Secondary | ICD-10-CM

## 2021-01-27 DIAGNOSIS — F4001 Agoraphobia with panic disorder: Secondary | ICD-10-CM | POA: Diagnosis not present

## 2021-01-27 MED ORDER — BUSPIRONE HCL 15 MG PO TABS: ORAL_TABLET | ORAL | 1 refills | Status: DC

## 2021-01-27 MED ORDER — ALPRAZOLAM 0.5 MG PO TABS: 0.5000 mg | ORAL_TABLET | Freq: Two times a day (BID) | ORAL | 5 refills | Status: DC | PRN

## 2021-01-27 MED ORDER — ESCITALOPRAM OXALATE 10 MG PO TABS: 10.0000 mg | ORAL_TABLET | Freq: Every day | ORAL | 1 refills | Status: DC

## 2021-01-27 NOTE — Progress Notes (Signed)
[facility]  Patient ID: Stacy Peck,  MRN: [id number]  PCP: Patient, No Pcp Per (Inactive)  Date of Evaluation: 01/27/2021  time spent:20 minutes  Chief Complaint:  Chief Complaint   Anxiety; Follow-up     HISTORY/CURRENT STATUS: HPI For routine med check.   Stacy Peck has been on Lexapro for approximately 4 months or so now.  In mid June we increased the dose up to 10 mg.  She thinks she is feeling better, did not have a sudden improvement but she is able to be in public more without having so much anxiety.  Not having panic attacks.  She does still have to use the Xanax 0.5 mg but takes 1/4 pill most of the time and that is still effective.  Was not sleeping well for a while, and the only reason she could come up with was the Lexapro in the evening.  She changed the timing to take it in the morning and the insomnia has gotten better.  Patient denies loss of interest in usual activities and is able to enjoy things.  Denies decreased energy or motivation.  Appetite has not changed.  No extreme sadness, tearfulness, or feelings of hopelessness.  Denies any changes in concentration, making decisions or remembering things.  Denies suicidal or homicidal thoughts.  Denies dizziness, syncope, seizures, numbness, tingling, tremor, tics, unsteady gait, slurred speech, confusion. Denies muscle or joint pain, stiffness, or dystonia.  Individual Medical History/ Review of Systems: Changes? :No    Past medications for mental health diagnoses include: Zoloft caused nausea, Prozac caused acne questionably, BuSpar, Luvox caused insomnia  Allergies: Patient has no known allergies.  Current Medications:  Current Outpatient Medications:    levothyroxine (SYNTHROID, LEVOTHROID) 75 MCG tablet, Take 75 mcg by mouth daily., Disp: , Rfl:    Multiple Vitamin (MULTIVITAMIN) tablet, Take 1 tablet by mouth daily., Disp: , Rfl:    spironolactone (ALDACTONE) 100 MG tablet, Take 100 mg by mouth daily.,  Disp: , Rfl:    TRI-PREVIFEM 0.18/0.215/0.25 MG-35 MCG tablet, Take 1 tablet by mouth daily., Disp: , Rfl:    ALPRAZolam (XANAX) 0.5 MG tablet, Take 1 tablet (0.5 mg total) by mouth 2 (two) times daily as needed for anxiety., Disp: 60 tablet, Rfl: 5   busPIRone (BUSPAR) 15 MG tablet, 1/2 po q am, 1/2 at lunch, and 1.5 po qhs., Disp: 225 tablet, Rfl: 1   escitalopram (LEXAPRO) 10 MG tablet, Take 1 tablet (10 mg total) by mouth daily., Disp: 90 tablet, Rfl: 1 Medication Side Effects: none  Family Medical/ Social History: Changes? No  MENTAL HEALTH EXAM:  unknown if currently breastfeeding.There is no height or weight on file to calculate BMI.  General Appearance: Casual and Well Groomed  Eye Contact:  Good  Speech:  Clear and Coherent and Normal Rate  Volume:  Normal  Mood:  Euthymic and much more relaxed than I think I have ever seen her.  Affect:  Congruent  Thought Process:  Goal Directed  Orientation:  Full (Time, Place, and Person)  Thought Content: Logical   Suicidal Thoughts:  No  Homicidal Thoughts:  No  Memory:  WNL  Judgement:  Good  Insight:  Good  Psychomotor Activity:  Normal  Concentration:  Concentration: Good  Recall:  Good  Fund of Knowledge: Good  Language: Good  Assets:  Desire for Improvement Financial Resources/Insurance [place] Talents/Skills Transportation Vocational/Educational  ADL's:  Intact  Cognition: WNL  Prognosis:  Good    DIAGNOSES:  ICD-10-CM   1. Social anxiety disorder  F40.10     2. Panic disorder with agoraphobia  F40.01         Receiving Psychotherapy: No    RECOMMENDATIONS:  PDMP reviewed.  Last Xanax was filled 11/26/2020. I provided 20 minutes of face to face time during this encounter, including time spent before and after the visit in records review, medical decision making, and charting.  I am glad to know she is doing better!  No reason to change any medications at this  point. Continue Lexapro 10 mg, daily. Continue Xanax 0.5 mg, 1 bid prn.  (She usually takes 1/4 pill whenever she does need it.) Cont Buspar 15 mg, 1/2 in morning, 1/2 at lunch, and 1.5 pills in evening.  Return in 6 months.   Donnal Moat, PA-C

## 2021-04-18 DIAGNOSIS — H6503 Acute serous otitis media, bilateral: Secondary | ICD-10-CM | POA: Diagnosis not present

## 2021-04-18 DIAGNOSIS — J069 Acute upper respiratory infection, unspecified: Secondary | ICD-10-CM | POA: Diagnosis not present

## 2021-04-30 DIAGNOSIS — E039 Hypothyroidism, unspecified: Secondary | ICD-10-CM | POA: Diagnosis not present

## 2021-05-05 DIAGNOSIS — R5383 Other fatigue: Secondary | ICD-10-CM | POA: Diagnosis not present

## 2021-05-05 DIAGNOSIS — R61 Generalized hyperhidrosis: Secondary | ICD-10-CM | POA: Diagnosis not present

## 2021-05-05 DIAGNOSIS — R635 Abnormal weight gain: Secondary | ICD-10-CM | POA: Diagnosis not present

## 2021-05-07 DIAGNOSIS — E039 Hypothyroidism, unspecified: Secondary | ICD-10-CM | POA: Diagnosis not present

## 2021-05-07 DIAGNOSIS — R5383 Other fatigue: Secondary | ICD-10-CM | POA: Diagnosis not present

## 2021-06-16 DIAGNOSIS — R110 Nausea: Secondary | ICD-10-CM | POA: Diagnosis not present

## 2021-06-16 DIAGNOSIS — R197 Diarrhea, unspecified: Secondary | ICD-10-CM | POA: Diagnosis not present

## 2021-06-16 DIAGNOSIS — R531 Weakness: Secondary | ICD-10-CM | POA: Diagnosis not present

## 2021-06-16 DIAGNOSIS — R109 Unspecified abdominal pain: Secondary | ICD-10-CM | POA: Diagnosis not present

## 2021-06-22 ENCOUNTER — Emergency Department (HOSPITAL_BASED_OUTPATIENT_CLINIC_OR_DEPARTMENT_OTHER): Payer: BC Managed Care – PPO

## 2021-06-22 ENCOUNTER — Encounter (HOSPITAL_BASED_OUTPATIENT_CLINIC_OR_DEPARTMENT_OTHER): Payer: Self-pay

## 2021-06-22 ENCOUNTER — Emergency Department (HOSPITAL_BASED_OUTPATIENT_CLINIC_OR_DEPARTMENT_OTHER)
Admission: EM | Admit: 2021-06-22 | Discharge: 2021-06-22 | Disposition: A | Discharge status: Home / Self Care | Payer: BC Managed Care – PPO | Attending: Emergency Medicine | Admitting: Emergency Medicine

## 2021-06-22 ENCOUNTER — Other Ambulatory Visit: Payer: Self-pay

## 2021-06-22 DIAGNOSIS — R1013 Epigastric pain: Secondary | ICD-10-CM | POA: Diagnosis not present

## 2021-06-22 DIAGNOSIS — R0789 Other chest pain: Secondary | ICD-10-CM | POA: Insufficient documentation

## 2021-06-22 DIAGNOSIS — R079 Chest pain, unspecified: Secondary | ICD-10-CM | POA: Diagnosis not present

## 2021-06-22 DIAGNOSIS — R9431 Abnormal electrocardiogram [ECG] [EKG]: Secondary | ICD-10-CM | POA: Diagnosis not present

## 2021-06-22 DIAGNOSIS — Z79899 Other long term (current) drug therapy: Secondary | ICD-10-CM | POA: Diagnosis not present

## 2021-06-22 LAB — CBC
HCT: 37.6 % (ref 36.0–46.0)
Hemoglobin: 13 g/dL (ref 12.0–15.0)
MCH: 31.9 pg (ref 26.0–34.0)
MCHC: 34.6 g/dL (ref 30.0–36.0)
MCV: 92.2 fL (ref 80.0–100.0)
Platelets: 277 10*3/uL (ref 150–400)
RBC: 4.08 MIL/uL (ref 3.87–5.11)
RDW: 11.6 % (ref 11.5–15.5)
WBC: 6.9 10*3/uL (ref 4.0–10.5)
nRBC: 0 % (ref 0.0–0.2)

## 2021-06-22 LAB — TROPONIN I (HIGH SENSITIVITY)
Troponin I (High Sensitivity): 4 ng/L (ref <18)
Troponin I (High Sensitivity): 9 ng/L (ref <18)

## 2021-06-22 LAB — BASIC METABOLIC PANEL
Anion gap: 11 (ref 5–15)
BUN: 21 mg/dL — ABNORMAL HIGH (ref 6–20)
CO2: 25 mmol/L (ref 22–32)
Calcium: 10.1 mg/dL (ref 8.9–10.3)
Chloride: 100 mmol/L (ref 98–111)
Creatinine, Ser: 0.9 mg/dL (ref 0.44–1.00)
GFR, Estimated: >60 mL/min (ref >60)
Glucose, Bld: 101 mg/dL — ABNORMAL HIGH (ref 70–99)
Potassium: 4.2 mmol/L (ref 3.5–5.1)
Sodium: 136 mmol/L (ref 135–145)

## 2021-06-22 LAB — HEPATIC FUNCTION PANEL
ALT: 18 U/L (ref 0–44)
AST: 27 U/L (ref 15–41)
Albumin: 4.7 g/dL (ref 3.5–5.0)
Alkaline Phosphatase: 41 U/L (ref 38–126)
Bilirubin, Direct: 0.1 mg/dL (ref 0.0–0.2)
Indirect Bilirubin: 0.6 mg/dL (ref 0.3–0.9)
Total Bilirubin: 0.7 mg/dL (ref 0.3–1.2)
Total Protein: 7.4 g/dL (ref 6.5–8.1)

## 2021-06-22 LAB — LIPASE, BLOOD: Lipase: 30 U/L (ref 11–51)

## 2021-06-22 LAB — PREGNANCY, URINE: Preg Test, Ur: NEGATIVE

## 2021-06-22 MED ORDER — SUCRALFATE 1 GM/10ML PO SUSP
1.0000 g | Freq: Once | ORAL | Status: AC
Start: 2021-06-22 — End: 2021-06-22
  Administered 2021-06-22: 1 g via ORAL
  Filled 2021-06-22: qty 10

## 2021-06-22 MED ORDER — SUCRALFATE 1 G PO TABS: 1.0000 g | ORAL_TABLET | Freq: Four times a day (QID) | ORAL | 0 refills | Status: DC

## 2021-06-22 MED ORDER — LIDOCAINE VISCOUS HCL 2 % MT SOLN
15.0000 mL | Freq: Once | OROMUCOSAL | Status: DC
  Filled 2021-06-22: qty 15

## 2021-06-22 MED ORDER — FAMOTIDINE 20 MG PO TABS: 20.0000 mg | ORAL_TABLET | Freq: Two times a day (BID) | ORAL | 0 refills | Status: DC

## 2021-06-22 MED ORDER — FAMOTIDINE 20 MG PO TABS
40.0000 mg | ORAL_TABLET | Freq: Once | ORAL | Status: AC
  Administered 2021-06-22: 40 mg via ORAL
  Filled 2021-06-22: qty 2

## 2021-06-22 MED ORDER — ALUM & MAG HYDROXIDE-SIMETH 200-200-20 MG/5ML PO SUSP
30.0000 mL | Freq: Once | ORAL | Status: AC
  Administered 2021-06-22: 30 mL via ORAL
  Filled 2021-06-22: qty 30

## 2021-06-22 MED ORDER — ALPRAZOLAM 0.5 MG PO TABS
0.5000 mg | ORAL_TABLET | Freq: Once | ORAL | Status: AC
Start: 2021-06-22 — End: 2021-06-22
  Administered 2021-06-22: 0.5 mg via ORAL
  Filled 2021-06-22 (×2): qty 1

## 2021-06-22 NOTE — ED Triage Notes (Signed)
Pt states she was working out about 1 hour ago and started having chest pain.

## 2021-06-22 NOTE — ED Provider Notes (Signed)
[facility] EMER[facility] DEPT Provider Note   CSN: [id number] Arrival date & time: 06/22/21  1340     History  Chief Complaint  Patient presents with   Chest Pain    Stacy Peck is a 41 y.o. female.  41 year old female presents with epigastric pain rating to her chest which began this afternoon.  Patient states that she works out daily and had no real issues with her work-up.  Stable to completed and she described as being high intensity.  States that she has not had any associated dyspnea or diaphoresis.  No nausea.  Symptoms have been persistent.  When she was younger, had issues with her diaphragm.  No recent fever cough or congestion.  No treatment use prior to arrival.      Home Medications Prior to Admission medications   Medication Sig Start Date End Date Taking? Authorizing Provider  ALPRAZolam Duanne Moron) 0.5 MG tablet Take 1 tablet (0.5 mg total) by mouth 2 (two) times daily as needed for anxiety. 01/27/21   Donnal Moat T, PA-C  busPIRone (BUSPAR) 15 MG tablet 1/2 po q am, 1/2 at lunch, and 1.5 po qhs. 01/27/21   Hurst, Helene Kelp T, PA-C  escitalopram (LEXAPRO) 10 MG tablet Take 1 tablet (10 mg total) by mouth daily. 01/27/21   Donnal Moat T, PA-C  levothyroxine (SYNTHROID, LEVOTHROID) 75 MCG tablet Take 75 mcg by mouth daily.    [provider]  Multiple Vitamin (MULTIVITAMIN) tablet Take 1 tablet by mouth daily.    [provider]  spironolactone (ALDACTONE) 100 MG tablet Take 100 mg by mouth daily. 06/27/18   [provider]  TRI-PREVIFEM 0.18/0.215/0.25 MG-35 MCG tablet Take 1 tablet by mouth daily. 07/09/18   [provider]      Allergies    Patient has no known allergies.    Review of Systems   Review of Systems  All other systems reviewed and are negative.  Physical Exam Updated Vital Signs BP 127/87    Pulse 75    Temp 98.1 F (36.7 C)    Resp 16    Ht 1.511 m (4' 11.5")    Wt 44.5 kg    SpO2 100%    BMI 19.46 kg/m   Physical Exam Vitals and nursing note reviewed.  Constitutional:      General: She is not in acute distress.    Appearance: Normal appearance. She is well-developed. She is not toxic-appearing.  HENT:     Head: Normocephalic and atraumatic.  Eyes:     General: Lids are normal.     Conjunctiva/sclera: Conjunctivae normal.     Pupils: Pupils are equal, round, and reactive to light.  Neck:     Thyroid: No thyroid mass.     Trachea: No tracheal deviation.  Cardiovascular:     Rate and Rhythm: Normal rate and regular rhythm.     Heart sounds: Normal heart sounds. No murmur heard.   No gallop.  Pulmonary:     Effort: Pulmonary effort is normal. No respiratory distress.     Breath sounds: Normal breath sounds. No stridor. No decreased breath sounds, wheezing, rhonchi or rales.  Abdominal:     General: There is no distension.     Palpations: Abdomen is soft.     Tenderness: There is abdominal tenderness in the epigastric area. There is no rebound.    Musculoskeletal:        General: No tenderness. Normal range of motion.     Cervical back: Normal  range of motion and neck supple.  Skin:    General: Skin is warm and dry.     Findings: No abrasion or rash.  Neurological:     Mental Status: She is alert and oriented to person, place, and time. Mental status is at baseline.     GCS: GCS eye subscore is 4. GCS verbal subscore is 5. GCS motor subscore is 6.     Cranial Nerves: No cranial nerve deficit.     Sensory: No sensory deficit.     Motor: Motor function is intact.  Psychiatric:        Attention and Perception: Attention normal.        Speech: Speech normal.        Behavior: Behavior normal.    ED Results / Procedures / Treatments   Labs (all labs ordered are listed, but only abnormal results are displayed) Labs Reviewed  BASIC METABOLIC PANEL - Abnormal; Notable for the following components:      Result Value   Glucose, Bld 101 (*)    BUN 21 (*)    All other components  within normal limits  CBC  PREGNANCY, URINE  LIPASE, BLOOD  HEPATIC FUNCTION PANEL  TROPONIN I (HIGH SENSITIVITY)  TROPONIN I (HIGH SENSITIVITY)    EKG EKG Interpretation  Date/Time:  Monday June 22 2021 13:53:57 EST Ventricular Rate:  70 PR Interval:  124 QRS Duration: 96 QT Interval:  388 QTC Calculation: 419 R Axis:   80 Text Interpretation: Normal sinus rhythm Incomplete right bundle branch block Borderline ECG No previous ECGs available Confirmed by Lacretia Leigh ([id number]) on 06/22/2021 4:17:25 PM  Radiology DG Chest 2 View  Result Date: 06/22/2021 CLINICAL DATA:  Chest pain. EXAM: CHEST - 2 VIEW COMPARISON:  None. FINDINGS: Trachea is midline. Heart size normal. Lungs are slightly hyperinflated but clear. No pleural fluid. Visualized upper abdomen is unremarkable. IMPRESSION: No acute findings. Electronically Signed   By: Lorin Picket M.D.   On: 06/22/2021 14:26    Procedures Procedures    Medications Ordered in ED Medications  alum & mag hydroxide-simeth (MAALOX/MYLANTA) 200-200-20 MG/5ML suspension 30 mL (has no administration in time range)    And  lidocaine (XYLOCAINE) 2 % viscous mouth solution 15 mL (has no administration in time range)  famotidine (PEPCID) tablet 40 mg (has no administration in time range)  sucralfate (CARAFATE) 1 GM/10ML suspension 1 g (has no administration in time range)    ED Course/ Medical Decision Making/ A&P                           Medical Decision Making Amount and/or Complexity of Data Reviewed Labs: ordered. Radiology: ordered.  Risk OTC drugs. Prescription drug management.  Records reviewed. Patient with EKG which shows normal sinus rhythm without signs of ischemia here.  Heart score is 1.  Very low suspicion this represents ACS or PE.  Feel like this is likely a hiatal hernia.  2 are negative.  Lipase and liver function test negative as well 2.  Patient given medication of GI cocktail with slight improvement.   Patient also feels more anxious and given Xanax per her request.  Will discharge home and placed on PPI and have patient follow-up with her primary care doctor        Final Clinical Impression(s) / ED Diagnoses Final diagnoses:  None    Rx / DC Orders ED Discharge Orders     None  Lacretia Leigh, MD 06/22/21 1745

## 2021-06-25 DIAGNOSIS — R079 Chest pain, unspecified: Secondary | ICD-10-CM | POA: Diagnosis not present

## 2021-07-28 ENCOUNTER — Ambulatory Visit (INDEPENDENT_AMBULATORY_CARE_PROVIDER_SITE_OTHER): Payer: BC Managed Care – PPO | Admitting: Physician Assistant

## 2021-07-28 ENCOUNTER — Other Ambulatory Visit: Payer: Self-pay

## 2021-07-28 ENCOUNTER — Encounter: Payer: Self-pay | Admitting: Physician Assistant

## 2021-07-28 DIAGNOSIS — F3289 Other specified depressive episodes: Secondary | ICD-10-CM | POA: Diagnosis not present

## 2021-07-28 DIAGNOSIS — F4001 Agoraphobia with panic disorder: Secondary | ICD-10-CM

## 2021-07-28 DIAGNOSIS — F410 Panic disorder [episodic paroxysmal anxiety] without agoraphobia: Secondary | ICD-10-CM

## 2021-07-28 DIAGNOSIS — F4010 Social phobia, unspecified: Secondary | ICD-10-CM

## 2021-07-28 MED ORDER — ALPRAZOLAM 0.25 MG PO TABS: 0.1250 mg | ORAL_TABLET | Freq: Two times a day (BID) | ORAL | 5 refills | Status: DC | PRN

## 2021-07-28 MED ORDER — ESCITALOPRAM OXALATE 10 MG PO TABS: 10.0000 mg | ORAL_TABLET | Freq: Every day | ORAL | 1 refills | Status: DC

## 2021-07-28 MED ORDER — BUSPIRONE HCL 15 MG PO TABS: ORAL_TABLET | ORAL | 1 refills | Status: DC

## 2021-07-28 NOTE — Progress Notes (Signed)
[facility]  Patient ID: Stacy Peck,  MRN: [id number]  PCP: Orvan July, NP  Date of Evaluation: 07/28/2021 time spent:30 minutes  Chief Complaint:  Chief Complaint   Anxiety; Depression; Follow-up      HISTORY/CURRENT STATUS: HPI For routine med check.   Doing well with the anxiety.  Feels like the Lexapro has really helped with the anxiety.  She is not having panic attacks like she did and she is able to go out in public when she needs to without being too uptight.  She thinks the BuSpar has also helped to prevent the anxiety.  She does take the Xanax occasionally.  She has been on the 0.5 mg and takes 1/4 pill usually when she does need it.  It is effective.  Patient denies loss of interest in usual activities and is able to enjoy things.  Denies decreased energy or motivation.  Appetite has not changed.  No extreme sadness, tearfulness, or feelings of hopelessness.  Denies any changes in concentration, making decisions or remembering things.  Sleeps well.  Denies suicidal or homicidal thoughts.  Denies dizziness, syncope, seizures, numbness, tingling, tremor, tics, unsteady gait, slurred speech, confusion. Denies muscle or joint pain, stiffness, or dystonia.  Individual Medical History/ Review of Systems: Changes? :Yes    went to ER 06/22/2021, for chest pain.  Cardiac or pulmonary causes were ruled out.  She was given Carafate and Pepcid.  She did not take the Carafate at all and the Pepcid for only a short time.  She has never had any experience like that before or since.  She is not sure what caused it.  Past medications for mental health diagnoses include: Zoloft caused nausea, Prozac caused acne questionably, BuSpar, Luvox caused insomnia  Allergies: Patient has no known allergies.  Current Medications:  Current Outpatient Medications:    ALPRAZolam (XANAX) 0.25 MG tablet, Take 0.5-1 tablets (0.125-0.25 mg total) by mouth 2 (two) times daily as needed for  anxiety., Disp: 60 tablet, Rfl: 5   levothyroxine (SYNTHROID, LEVOTHROID) 75 MCG tablet, Take 75 mcg by mouth daily., Disp: , Rfl:    Multiple Vitamin (MULTIVITAMIN) tablet, Take 1 tablet by mouth daily., Disp: , Rfl:    spironolactone (ALDACTONE) 100 MG tablet, Take 100 mg by mouth daily., Disp: , Rfl:    TRI-PREVIFEM 0.18/0.215/0.25 MG-35 MCG tablet, Take 1 tablet by mouth daily., Disp: , Rfl:    busPIRone (BUSPAR) 15 MG tablet, 1/2 po q am, 1/2 at lunch, and 1.5 po qhs., Disp: 225 tablet, Rfl: 1   escitalopram (LEXAPRO) 10 MG tablet, Take 1 tablet (10 mg total) by mouth daily., Disp: 90 tablet, Rfl: 1   famotidine (PEPCID) 20 MG tablet, Take 1 tablet (20 mg total) by mouth 2 (two) times daily. (Patient not taking: Reported on 07/28/2021), Disp: 30 tablet, Rfl: 0   sucralfate (CARAFATE) 1 g tablet, Take 1 tablet (1 g total) by mouth 4 (four) times daily. (Patient not taking: Reported on 07/28/2021), Disp: 30 tablet, Rfl: 0 Medication Side Effects: none  Family Medical/ Social History: Changes? No  MENTAL HEALTH EXAM:  unknown if currently breastfeeding.There is no height or weight on file to calculate BMI.  General Appearance: Casual and Well Groomed  Eye Contact:  Good  Speech:  Clear and Coherent and Normal Rate  Volume:  Normal  Mood:  Euthymic  Affect:  Congruent  Thought Process:  Goal Directed  Orientation:  Full (Time, Place, and Person)  Thought Content: Logical  Suicidal Thoughts:  No  Homicidal Thoughts:  No  Memory:  WNL  Judgement:  Good  Insight:  Good  Psychomotor Activity:  Normal  Concentration:  Concentration: Good  Recall:  Good  Fund of Knowledge: Good  Language: Good  Assets:  Desire for Improvement Financial Resources/Insurance [place] Talents/Skills Transportation Vocational/Educational  ADL's:  Intact  Cognition: WNL  Prognosis:  Good   ER notes from 06/22/2021 were reviewed. CBC normal CMP  normal Troponin levels were normal  DIAGNOSES:    ICD-10-CM   1. Social anxiety disorder  F40.10     2. Panic disorder with agoraphobia  F40.01     3. Panic attacks  F41.0     4. Other depression  F32.89        Receiving Psychotherapy: No    RECOMMENDATIONS:  PDMP reviewed.  Last Xanax was filled 06/23/2021. I provided 30 minutes of face to face time during this encounter, including time spent before and after the visit in records review, medical decision making, counseling pertinent to today's visit, and charting.  We discussed the chest pain that she was having and the results of labs.  I am glad that she has not had any further pain, and that the work-up was negative.  She knows that her anxiety could have been a factor, and thankfully that is better controlled than it has been in the past.  No changes will be made in her medications.  Continue Lexapro 10 mg, daily. Change Xanax to 0.25 mg, she can take 1/2-1 twice a day as needed, this will be easier for her since she is splitting a 0.5 mg pill into fourths anyway.  She understands.   Cont Buspar 15 mg, 1/2 in morning, 1/2 at lunch, and 1.5 pills in evening.  Return in 6 months.   Donnal Moat, PA-C

## 2021-11-19 DIAGNOSIS — L821 Other seborrheic keratosis: Secondary | ICD-10-CM | POA: Diagnosis not present

## 2021-11-19 DIAGNOSIS — L578 Other skin changes due to chronic exposure to nonionizing radiation: Secondary | ICD-10-CM | POA: Diagnosis not present

## 2021-11-19 DIAGNOSIS — D485 Neoplasm of uncertain behavior of skin: Secondary | ICD-10-CM | POA: Diagnosis not present

## 2021-11-19 DIAGNOSIS — D2230 Melanocytic nevi of unspecified part of face: Secondary | ICD-10-CM | POA: Diagnosis not present

## 2021-12-19 DIAGNOSIS — S70362A Insect bite (nonvenomous), left thigh, initial encounter: Secondary | ICD-10-CM | POA: Diagnosis not present

## 2021-12-30 DIAGNOSIS — D225 Melanocytic nevi of trunk: Secondary | ICD-10-CM | POA: Diagnosis not present

## 2021-12-30 DIAGNOSIS — D485 Neoplasm of uncertain behavior of skin: Secondary | ICD-10-CM | POA: Diagnosis not present

## 2022-01-21 DIAGNOSIS — Z1231 Encounter for screening mammogram for malignant neoplasm of breast: Secondary | ICD-10-CM | POA: Diagnosis not present

## 2022-01-21 DIAGNOSIS — Z01419 Encounter for gynecological examination (general) (routine) without abnormal findings: Secondary | ICD-10-CM | POA: Diagnosis not present

## 2022-01-21 DIAGNOSIS — Z6820 Body mass index (BMI) 20.0-20.9, adult: Secondary | ICD-10-CM | POA: Diagnosis not present

## 2022-01-25 ENCOUNTER — Encounter: Payer: Self-pay | Admitting: Physician Assistant

## 2022-01-25 ENCOUNTER — Ambulatory Visit (INDEPENDENT_AMBULATORY_CARE_PROVIDER_SITE_OTHER): Payer: BC Managed Care – PPO | Admitting: Physician Assistant

## 2022-01-25 DIAGNOSIS — F411 Generalized anxiety disorder: Secondary | ICD-10-CM

## 2022-01-25 DIAGNOSIS — F3342 Major depressive disorder, recurrent, in full remission: Secondary | ICD-10-CM | POA: Diagnosis not present

## 2022-01-25 DIAGNOSIS — F4010 Social phobia, unspecified: Secondary | ICD-10-CM

## 2022-01-25 MED ORDER — BUSPIRONE HCL 15 MG PO TABS: ORAL_TABLET | ORAL | 1 refills | Status: DC

## 2022-01-25 MED ORDER — ALPRAZOLAM 0.25 MG PO TABS: 0.1250 mg | ORAL_TABLET | Freq: Two times a day (BID) | ORAL | 5 refills | Status: DC | PRN

## 2022-01-25 MED ORDER — ESCITALOPRAM OXALATE 10 MG PO TABS: 10.0000 mg | ORAL_TABLET | Freq: Every day | ORAL | 1 refills | Status: DC

## 2022-01-25 NOTE — Progress Notes (Signed)
[facility]  Patient ID: Niharika Savino,  MRN: [id number]  PCP: Orvan July, NP  Date of Evaluation: 01/25/2022 time spent:20 minutes  Chief Complaint:  Chief Complaint   Anxiety; Follow-up    HISTORY/CURRENT STATUS: HPI For routine med check.   Gresia is doing very well.  The current combination of medications has been very helpful in controlling the anxiety and the mild depression.  She does not get panicky like she used to.  She does still have social anxiety at times but not as severe and the Xanax is helpful when needed.  She does not take it even weekly.  No panic attacks.  Denies palpitations or tachycardia.  Patient is able to enjoy things.  Energy and motivation are good.  She continues to work out regularly.  Does not work by choice.  No extreme sadness, tearfulness, or feelings of hopelessness.  Sleeps well.  ADLs and personal hygiene are normal.   Denies any changes in concentration, making decisions, or remembering things.  Appetite has not changed.  Weight is stable.  Denies suicidal or homicidal thoughts.  Denies dizziness, syncope, seizures, numbness, tingling, tremor, tics, unsteady gait, slurred speech, confusion. Denies muscle or joint pain, stiffness, or dystonia.  Individual Medical History/ Review of Systems: Changes? :Yes   she had a mole removed a few weeks ago.  It showed atypical cells so she will have to return for surgery until margins are clear.   Past medications for mental health diagnoses include: Zoloft caused nausea, Prozac caused acne questionably, BuSpar, Luvox caused insomnia  Allergies: Patient has no known allergies.  Current Medications:  Current Outpatient Medications:    levothyroxine (SYNTHROID, LEVOTHROID) 75 MCG tablet, Take 75 mcg by mouth daily., Disp: , Rfl:    Multiple Vitamin (MULTIVITAMIN) tablet, Take 1 tablet by mouth daily., Disp: , Rfl:    spironolactone (ALDACTONE) 100 MG tablet, Take 100 mg by mouth daily., Disp: , Rfl:     TRI-PREVIFEM 0.18/0.215/0.25 MG-35 MCG tablet, Take 1 tablet by mouth daily., Disp: , Rfl:    ALPRAZolam (XANAX) 0.25 MG tablet, Take 0.5-1 tablets (0.125-0.25 mg total) by mouth 2 (two) times daily as needed for anxiety., Disp: 60 tablet, Rfl: 5   busPIRone (BUSPAR) 15 MG tablet, 1/2 po q am, 1/2 at lunch, and 1.5 po qhs., Disp: 225 tablet, Rfl: 1   escitalopram (LEXAPRO) 10 MG tablet, Take 1 tablet (10 mg total) by mouth daily., Disp: 90 tablet, Rfl: 1 Medication Side Effects: none  Family Medical/ Social History: Changes? No   MENTAL HEALTH EXAM:  unknown if currently breastfeeding.There is no height or weight on file to calculate BMI.  General Appearance: Casual and Well Groomed  Eye Contact:  Good  Speech:  Clear and Coherent and Normal Rate  Volume:  Normal  Mood:  Euthymic  Affect:  Congruent  Thought Process:  Goal Directed and Descriptions of Associations: Circumstantial  Orientation:  Full (Time, Place, and Person)  Thought Content: Logical   Suicidal Thoughts:  No  Homicidal Thoughts:  No  Memory:  WNL  Judgement:  Good  Insight:  Good  Psychomotor Activity:  Normal  Concentration:  Concentration: Good  Recall:  Good  Fund of Knowledge: Good  Language: Good  Assets:  Desire for Improvement Financial Resources/Insurance Housing Physical Health Transportation  ADL's:  Intact  Cognition: WNL  Prognosis:  Good   DIAGNOSES:    ICD-10-CM   1. Social anxiety disorder  F40.10     2. Generalized  anxiety disorder  F41.1     3. Recurrent major depressive disorder, in full remission ([place])  F33.42      Receiving Psychotherapy: No   RECOMMENDATIONS:  PDMP reviewed.  Last Xanax was filled 07/28/2021. I provided 20 minutes of face to face time during this encounter, including time spent before and after the visit in records review, medical decision making, counseling pertinent to today's visit, and charting.   She is doing great so no changes in meds are  necessary.  Change Xanax to 0.25 mg, she can take 1/2-1 twice a day as needed. Cont Buspar 15 mg, 1/2 in morning, 1/2 at lunch, and 1.5 pills in evening.  Continue Lexapro 10 mg, 1 p.o. daily. Return in 6 months.  Donnal Moat, PA-C

## 2022-01-28 IMAGING — DX DG CHEST 2V
2 series · 2 of 2 positions shown · non-contrast
Comparison: None.

CLINICAL DATA: Chest pain.

EXAM:
CHEST - 2 VIEW

[chest pa]
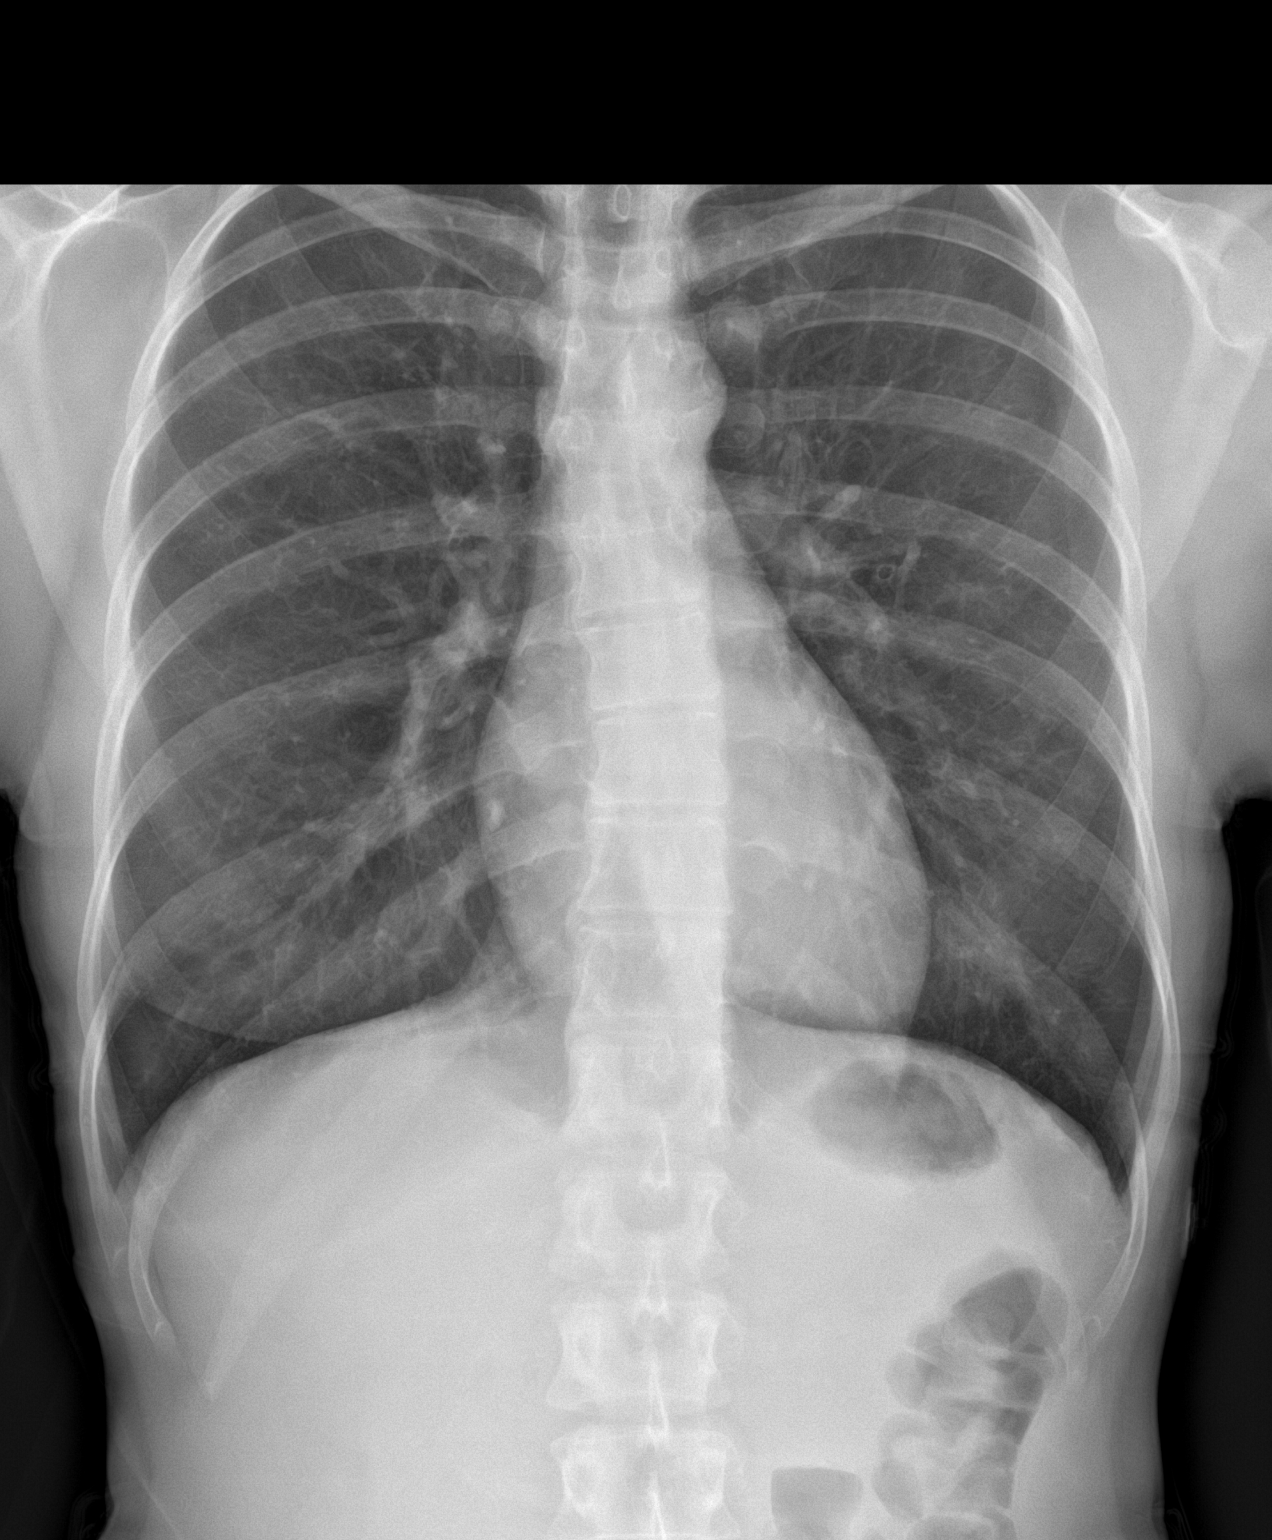

[chest lat]
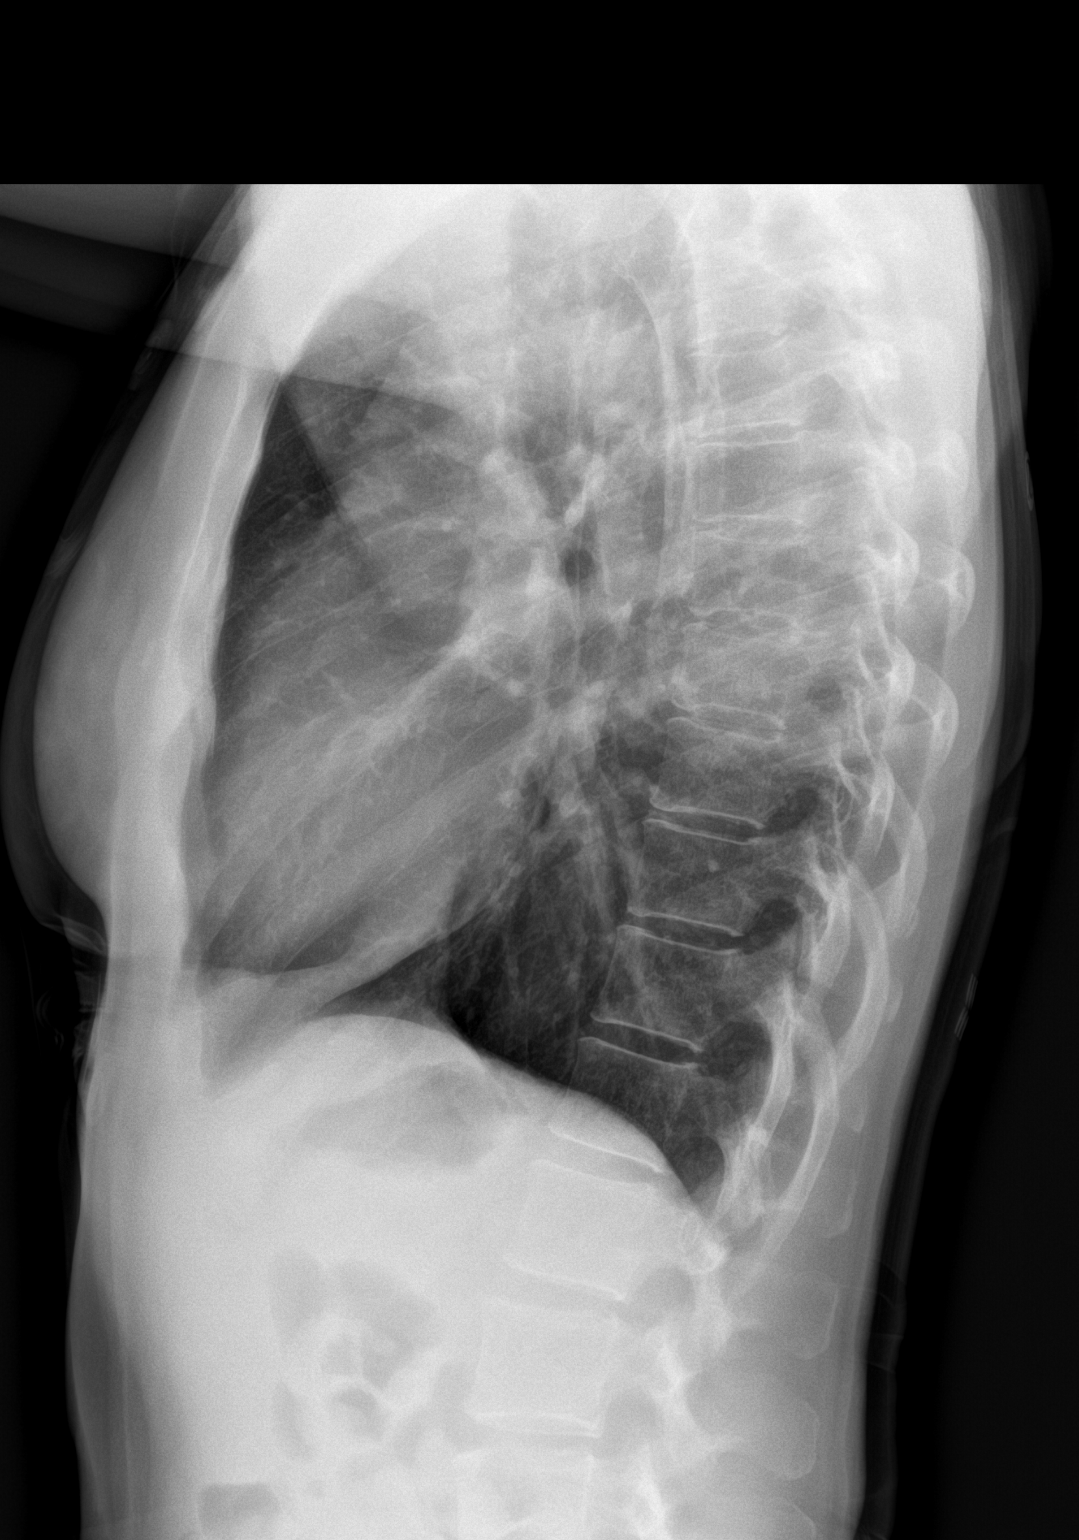

[2 of 2 positions shown; findings below may reference images not displayed]

FINDINGS: Trachea is midline. Heart size normal. Lungs are slightly
hyperinflated but clear. No pleural fluid. Visualized upper abdomen
is unremarkable.
IMPRESSION: No acute findings.

## 2022-02-17 DIAGNOSIS — Z0000 Encounter for general adult medical examination without abnormal findings: Secondary | ICD-10-CM | POA: Diagnosis not present

## 2022-02-18 DIAGNOSIS — Z0000 Encounter for general adult medical examination without abnormal findings: Secondary | ICD-10-CM | POA: Diagnosis not present

## 2022-02-18 DIAGNOSIS — Z13220 Encounter for screening for lipoid disorders: Secondary | ICD-10-CM | POA: Diagnosis not present

## 2022-03-18 DIAGNOSIS — D485 Neoplasm of uncertain behavior of skin: Secondary | ICD-10-CM | POA: Diagnosis not present

## 2022-03-18 DIAGNOSIS — D225 Melanocytic nevi of trunk: Secondary | ICD-10-CM | POA: Diagnosis not present

## 2022-04-30 DIAGNOSIS — E039 Hypothyroidism, unspecified: Secondary | ICD-10-CM | POA: Diagnosis not present

## 2022-05-07 DIAGNOSIS — E039 Hypothyroidism, unspecified: Secondary | ICD-10-CM | POA: Diagnosis not present

## 2022-06-18 DIAGNOSIS — E039 Hypothyroidism, unspecified: Secondary | ICD-10-CM | POA: Diagnosis not present

## 2022-06-23 ENCOUNTER — Other Ambulatory Visit: Payer: Self-pay | Admitting: Physician Assistant

## 2022-07-24 DIAGNOSIS — B029 Zoster without complications: Secondary | ICD-10-CM | POA: Diagnosis not present

## 2022-07-28 ENCOUNTER — Ambulatory Visit: Payer: BC Managed Care – PPO | Admitting: Physician Assistant

## 2022-08-12 ENCOUNTER — Ambulatory Visit: Payer: BC Managed Care – PPO | Admitting: Physician Assistant

## 2022-08-13 ENCOUNTER — Encounter: Payer: Self-pay | Admitting: Physician Assistant

## 2022-08-13 ENCOUNTER — Ambulatory Visit (INDEPENDENT_AMBULATORY_CARE_PROVIDER_SITE_OTHER): Payer: BC Managed Care – PPO | Admitting: Physician Assistant

## 2022-08-13 DIAGNOSIS — F411 Generalized anxiety disorder: Secondary | ICD-10-CM

## 2022-08-13 DIAGNOSIS — F3342 Major depressive disorder, recurrent, in full remission: Secondary | ICD-10-CM | POA: Diagnosis not present

## 2022-08-13 DIAGNOSIS — F4001 Agoraphobia with panic disorder: Secondary | ICD-10-CM | POA: Diagnosis not present

## 2022-08-13 MED ORDER — ALPRAZOLAM 0.25 MG PO TABS: 0.1250 mg | ORAL_TABLET | Freq: Two times a day (BID) | ORAL | 5 refills | Status: DC | PRN

## 2022-08-13 MED ORDER — ESCITALOPRAM OXALATE 10 MG PO TABS: 10.0000 mg | ORAL_TABLET | Freq: Every day | ORAL | 1 refills | Status: DC

## 2022-08-13 NOTE — Progress Notes (Signed)
[facility]  Patient ID: Stacy Peck,  MRN: [id number]  PCP: Orvan July, NP (Inactive)  Date of Evaluation: 08/13/2022 time spent:20 minutes  Chief Complaint:  Chief Complaint   Anxiety    HISTORY/CURRENT STATUS: HPI For routine med check.   Has been doing well except for the fact that her husband has been in the hospital.  He had intussusception of the colon, colonoscopy showed a large polyp.  It was mostly removed but biopsy results are not back yet.  Either way he will have to have surgery to remove the remainder of the polyp.  If it was not for that, states she would be doing fine.  And even with all this she is handling it well.  Not having panic attacks.  When she does need the Xanax it is effective.  Patient is able to enjoy things.  Energy and motivation are good.  No extreme sadness, tearfulness, or feelings of hopelessness.  Sleeps well most of the time. ADLs and personal hygiene are normal.   Denies any changes in concentration, making decisions, or remembering things.  Appetite has not changed.  Weight is stable.  Denies suicidal or homicidal thoughts.  Denies dizziness, syncope, seizures, numbness, tingling, tremor, tics, unsteady gait, slurred speech, confusion. Denies muscle or joint pain, stiffness, or dystonia.  Individual Medical History/ Review of Systems: Changes? :No      Past medications for mental health diagnoses include: Zoloft caused nausea, Prozac caused acne questionably, BuSpar, Luvox caused insomnia  Allergies: Patient has no known allergies.  Current Medications:  Current Outpatient Medications:    ALPRAZolam (XANAX) 0.25 MG tablet, Take 0.5-1 tablets (0.125-0.25 mg total) by mouth 2 (two) times daily as needed for anxiety., Disp: 60 tablet, Rfl: 5   busPIRone (BUSPAR) 15 MG tablet, 1/2 TAB EVERY MORNING, 1/2 TAB AT LUNCH, AND 1.5 TABS AT BEDTIME (Patient taking differently: 1/3 am and 1.5 TABS AT BEDTIME), Disp: 225 tablet, Rfl: 0    escitalopram (LEXAPRO) 10 MG tablet, Take 1 tablet (10 mg total) by mouth daily., Disp: 90 tablet, Rfl: 1   levothyroxine (SYNTHROID, LEVOTHROID) 75 MCG tablet, Take 75 mcg by mouth daily., Disp: , Rfl:    Multiple Vitamin (MULTIVITAMIN) tablet, Take 1 tablet by mouth daily., Disp: , Rfl:    spironolactone (ALDACTONE) 100 MG tablet, Take 100 mg by mouth daily., Disp: , Rfl:    TRI-PREVIFEM 0.18/0.215/0.25 MG-35 MCG tablet, Take 1 tablet by mouth daily., Disp: , Rfl:  Medication Side Effects: none  Family Medical/ Social History: Changes? No   MENTAL HEALTH EXAM:  unknown if currently breastfeeding.There is no height or weight on file to calculate BMI.  General Appearance: Casual and Well Groomed  Eye Contact:  Good  Speech:  Clear and Coherent and Normal Rate  Volume:  Normal  Mood:  Euthymic  Affect:  Congruent  Thought Process:  Goal Directed and Descriptions of Associations: Circumstantial  Orientation:  Full (Time, Place, and Person)  Thought Content: Logical   Suicidal Thoughts:  No  Homicidal Thoughts:  No  Memory:  WNL  Judgement:  Good  Insight:  Good  Psychomotor Activity:  Normal  Concentration:  Concentration: Good  Recall:  Good  Fund of Knowledge: Good  Language: Good  Assets:  Desire for Improvement Financial Resources/Insurance Housing Physical Health Resilience Transportation  ADL's:  Intact  Cognition: WNL  Prognosis:  Good   DIAGNOSES:    ICD-10-CM   1. Panic disorder with agoraphobia  F40.01  2. Generalized anxiety disorder  F41.1     3. Recurrent major depressive disorder, in full remission ([place])  F33.42      Receiving Psychotherapy: No   RECOMMENDATIONS:  PDMP reviewed.  Last Xanax was filled 01/25/2022. I provided 20 minutes of face to face time during this encounter, including time spent before and after the visit in records review, medical decision making, counseling pertinent to today's visit, and charting.   She is doing great so no  changes in meds are necessary.  Change Xanax to 0.25 mg, she can take 1/2-1 twice a day as needed. Cont Buspar 15 mg, 1/3 po q am,  and 1.5 pills in evening.  Continue Lexapro 10 mg, 1 p.o. daily. Return in 6 months.  Donnal Moat, PA-C

## 2022-12-07 DIAGNOSIS — L700 Acne vulgaris: Secondary | ICD-10-CM | POA: Diagnosis not present

## 2022-12-07 DIAGNOSIS — D225 Melanocytic nevi of trunk: Secondary | ICD-10-CM | POA: Diagnosis not present

## 2022-12-07 DIAGNOSIS — B070 Plantar wart: Secondary | ICD-10-CM | POA: Diagnosis not present

## 2022-12-07 DIAGNOSIS — L578 Other skin changes due to chronic exposure to nonionizing radiation: Secondary | ICD-10-CM | POA: Diagnosis not present

## 2022-12-13 ENCOUNTER — Other Ambulatory Visit: Payer: Self-pay | Admitting: Physician Assistant

## 2022-12-14 NOTE — Telephone Encounter (Signed)
Cont Buspar 15 mg, 1/3 po q am,  and 1.5 pills in evening.  Verify dosing

## 2022-12-15 NOTE — Telephone Encounter (Signed)
LVM to [facility] 

## 2022-12-17 NOTE — Telephone Encounter (Signed)
Sent My-Chart message

## 2022-12-20 NOTE — Telephone Encounter (Signed)
LVM with husband.

## 2022-12-20 NOTE — Telephone Encounter (Signed)
Left another VM 

## 2023-01-26 ENCOUNTER — Other Ambulatory Visit: Payer: Self-pay | Admitting: Physician Assistant

## 2023-01-26 DIAGNOSIS — I781 Nevus, non-neoplastic: Secondary | ICD-10-CM | POA: Diagnosis not present

## 2023-02-09 DIAGNOSIS — Z6820 Body mass index (BMI) 20.0-20.9, adult: Secondary | ICD-10-CM | POA: Diagnosis not present

## 2023-02-09 DIAGNOSIS — Z124 Encounter for screening for malignant neoplasm of cervix: Secondary | ICD-10-CM | POA: Diagnosis not present

## 2023-02-09 DIAGNOSIS — Z1231 Encounter for screening mammogram for malignant neoplasm of breast: Secondary | ICD-10-CM | POA: Diagnosis not present

## 2023-02-09 DIAGNOSIS — Z01419 Encounter for gynecological examination (general) (routine) without abnormal findings: Secondary | ICD-10-CM | POA: Diagnosis not present

## 2023-02-09 DIAGNOSIS — Z1151 Encounter for screening for human papillomavirus (HPV): Secondary | ICD-10-CM | POA: Diagnosis not present

## 2023-02-11 ENCOUNTER — Encounter: Payer: Self-pay | Admitting: Physician Assistant

## 2023-02-11 ENCOUNTER — Ambulatory Visit: Payer: BC Managed Care – PPO | Admitting: Physician Assistant

## 2023-02-11 DIAGNOSIS — F3342 Major depressive disorder, recurrent, in full remission: Secondary | ICD-10-CM | POA: Diagnosis not present

## 2023-02-11 DIAGNOSIS — F4010 Social phobia, unspecified: Secondary | ICD-10-CM | POA: Diagnosis not present

## 2023-02-11 DIAGNOSIS — F411 Generalized anxiety disorder: Secondary | ICD-10-CM

## 2023-02-11 MED ORDER — ALPRAZOLAM 0.25 MG PO TABS: 0.1250 mg | ORAL_TABLET | Freq: Two times a day (BID) | ORAL | 5 refills | Status: DC | PRN

## 2023-02-11 NOTE — Progress Notes (Signed)
[facility]  Patient ID: Stacy Peck,  MRN: [id number]  PCP: Janace Aris, NP  Date of Evaluation: 02/11/2023 time spent: 21 minutes  Chief Complaint:  Chief Complaint   Anxiety; Follow-up    HISTORY/CURRENT STATUS: HPI For routine med check.   Doing really well. Anxiety is very well controlled. Rarely needs the Xanax but when she does take it, it helps. The Buspar and Lexapro have helped prevent it.   Patient is able to enjoy things.  Energy and motivation are good.  She volunteers a lot for PTO functions, works out, takes care of her home. Isn't working outside the home.   No extreme sadness, tearfulness, or feelings of hopelessness.  Sleeps well. ADLs and personal hygiene are normal.   Denies any changes in concentration, making decisions, or remembering things.  Appetite has not changed.  Weight is stable.  Denies suicidal or homicidal thoughts.  Patient denies increased energy with decreased need for sleep, increased talkativeness, racing thoughts, impulsivity or risky behaviors, increased spending, increased libido, grandiosity, increased irritability or anger, paranoia, or hallucinations.  Denies dizziness, syncope, seizures, numbness, tingling, tremor, tics, unsteady gait, slurred speech, confusion. Denies muscle or joint pain, stiffness, or dystonia.  Individual Medical History/ Review of Systems: Changes? :No      Past medications for mental health diagnoses include: Zoloft caused nausea, Prozac caused acne questionably, BuSpar, Luvox caused insomnia  Allergies: Patient has no known allergies.  Current Medications:  Current Outpatient Medications:    busPIRone (BUSPAR) 15 MG tablet, 1/3 am and 1.5 TABS AT BEDTIME, Disp: 225 tablet, Rfl: 0   escitalopram (LEXAPRO) 10 MG tablet, TAKE 1 TABLET BY MOUTH EVERY DAY, Disp: 90 tablet, Rfl: 1   levothyroxine (SYNTHROID, LEVOTHROID) 75 MCG tablet, Take 75 mcg by mouth daily., Disp: , Rfl:    Multiple Vitamin  (MULTIVITAMIN) tablet, Take 1 tablet by mouth daily., Disp: , Rfl:    spironolactone (ALDACTONE) 100 MG tablet, Take 100 mg by mouth daily., Disp: , Rfl:    TRI-PREVIFEM 0.18/0.215/0.25 MG-35 MCG tablet, Take 1 tablet by mouth daily., Disp: , Rfl:    ALPRAZolam (XANAX) 0.25 MG tablet, Take 0.5-1 tablets (0.125-0.25 mg total) by mouth 2 (two) times daily as needed for anxiety., Disp: 60 tablet, Rfl: 5 Medication Side Effects: none  Family Medical/ Social History: Changes? No   MENTAL HEALTH EXAM:  unknown if currently breastfeeding.There is no height or weight on file to calculate BMI.  General Appearance: Casual and Well Groomed  Eye Contact:  Good  Speech:  Clear and Coherent and Normal Rate  Volume:  Normal  Mood:  Euthymic  Affect:  Congruent  Thought Process:  Goal Directed and Descriptions of Associations: Circumstantial  Orientation:  Full (Time, Place, and Person)  Thought Content: Logical   Suicidal Thoughts:  No  Homicidal Thoughts:  No  Memory:  WNL  Judgement:  Good  Insight:  Good  Psychomotor Activity:  Normal  Concentration:  Concentration: Good  Recall:  Good  Fund of Knowledge: Good  Language: Good  Assets:  Desire for Improvement Financial Resources/Insurance Housing Physical Health Resilience Transportation  ADL's:  Intact  Cognition: WNL  Prognosis:  Good   DIAGNOSES:    ICD-10-CM   1. Generalized anxiety disorder  F41.1     2. Social anxiety disorder  F40.10     3. Recurrent major depressive disorder, in full remission ([facility])  F33.42       Receiving Psychotherapy: No   RECOMMENDATIONS:  PDMP  reviewed.  Last Xanax was filled 08/13/2022 I provided 21 minutes of face to face time during this encounter, including time spent before and after the visit in records review, medical decision making, counseling pertinent to today's visit, and charting.   She is doing very well so no change in treatment necessary.  Change Xanax to 0.25 mg, she can take  1/2-1 twice a day as needed. Cont Buspar 15 mg, 1/3 po q am,  and 1.5 pills in evening.  Continue Lexapro 10 mg, 1 p.o. daily. Return in 6 months.  [name], PA-C

## 2023-02-21 DIAGNOSIS — Z23 Encounter for immunization: Secondary | ICD-10-CM | POA: Diagnosis not present

## 2023-02-21 DIAGNOSIS — Z0000 Encounter for general adult medical examination without abnormal findings: Secondary | ICD-10-CM | POA: Diagnosis not present

## 2023-02-23 DIAGNOSIS — Z13220 Encounter for screening for lipoid disorders: Secondary | ICD-10-CM | POA: Diagnosis not present

## 2023-02-23 DIAGNOSIS — Z0000 Encounter for general adult medical examination without abnormal findings: Secondary | ICD-10-CM | POA: Diagnosis not present

## 2023-04-04 DIAGNOSIS — J3081 Allergic rhinitis due to animal (cat) (dog) hair and dander: Secondary | ICD-10-CM | POA: Diagnosis not present

## 2023-04-04 DIAGNOSIS — J301 Allergic rhinitis due to pollen: Secondary | ICD-10-CM | POA: Diagnosis not present

## 2023-04-04 DIAGNOSIS — J3089 Other allergic rhinitis: Secondary | ICD-10-CM | POA: Diagnosis not present

## 2023-04-04 DIAGNOSIS — H1045 Other chronic allergic conjunctivitis: Secondary | ICD-10-CM | POA: Diagnosis not present

## 2023-05-10 DIAGNOSIS — M542 Cervicalgia: Secondary | ICD-10-CM | POA: Diagnosis not present

## 2023-05-10 DIAGNOSIS — M9901 Segmental and somatic dysfunction of cervical region: Secondary | ICD-10-CM | POA: Diagnosis not present

## 2023-05-10 DIAGNOSIS — M62838 Other muscle spasm: Secondary | ICD-10-CM | POA: Diagnosis not present

## 2023-05-10 DIAGNOSIS — M9902 Segmental and somatic dysfunction of thoracic region: Secondary | ICD-10-CM | POA: Diagnosis not present

## 2023-05-12 DIAGNOSIS — M9902 Segmental and somatic dysfunction of thoracic region: Secondary | ICD-10-CM | POA: Diagnosis not present

## 2023-05-12 DIAGNOSIS — M62838 Other muscle spasm: Secondary | ICD-10-CM | POA: Diagnosis not present

## 2023-05-12 DIAGNOSIS — M9901 Segmental and somatic dysfunction of cervical region: Secondary | ICD-10-CM | POA: Diagnosis not present

## 2023-05-12 DIAGNOSIS — M542 Cervicalgia: Secondary | ICD-10-CM | POA: Diagnosis not present

## 2023-05-13 DIAGNOSIS — E039 Hypothyroidism, unspecified: Secondary | ICD-10-CM | POA: Diagnosis not present

## 2023-05-16 DIAGNOSIS — M542 Cervicalgia: Secondary | ICD-10-CM | POA: Diagnosis not present

## 2023-05-16 DIAGNOSIS — M9901 Segmental and somatic dysfunction of cervical region: Secondary | ICD-10-CM | POA: Diagnosis not present

## 2023-05-16 DIAGNOSIS — M62838 Other muscle spasm: Secondary | ICD-10-CM | POA: Diagnosis not present

## 2023-05-16 DIAGNOSIS — M9902 Segmental and somatic dysfunction of thoracic region: Secondary | ICD-10-CM | POA: Diagnosis not present

## 2023-05-17 DIAGNOSIS — M9902 Segmental and somatic dysfunction of thoracic region: Secondary | ICD-10-CM | POA: Diagnosis not present

## 2023-05-17 DIAGNOSIS — M9901 Segmental and somatic dysfunction of cervical region: Secondary | ICD-10-CM | POA: Diagnosis not present

## 2023-05-17 DIAGNOSIS — M542 Cervicalgia: Secondary | ICD-10-CM | POA: Diagnosis not present

## 2023-05-17 DIAGNOSIS — M62838 Other muscle spasm: Secondary | ICD-10-CM | POA: Diagnosis not present

## 2023-05-18 DIAGNOSIS — M62838 Other muscle spasm: Secondary | ICD-10-CM | POA: Diagnosis not present

## 2023-05-18 DIAGNOSIS — M542 Cervicalgia: Secondary | ICD-10-CM | POA: Diagnosis not present

## 2023-05-18 DIAGNOSIS — M9901 Segmental and somatic dysfunction of cervical region: Secondary | ICD-10-CM | POA: Diagnosis not present

## 2023-05-18 DIAGNOSIS — M9902 Segmental and somatic dysfunction of thoracic region: Secondary | ICD-10-CM | POA: Diagnosis not present

## 2023-05-19 DIAGNOSIS — E039 Hypothyroidism, unspecified: Secondary | ICD-10-CM | POA: Diagnosis not present

## 2023-06-06 DIAGNOSIS — M62830 Muscle spasm of back: Secondary | ICD-10-CM | POA: Diagnosis not present

## 2023-06-06 DIAGNOSIS — M542 Cervicalgia: Secondary | ICD-10-CM | POA: Diagnosis not present

## 2023-06-06 DIAGNOSIS — M9901 Segmental and somatic dysfunction of cervical region: Secondary | ICD-10-CM | POA: Diagnosis not present

## 2023-06-06 DIAGNOSIS — M9902 Segmental and somatic dysfunction of thoracic region: Secondary | ICD-10-CM | POA: Diagnosis not present

## 2023-06-06 DIAGNOSIS — M62838 Other muscle spasm: Secondary | ICD-10-CM | POA: Diagnosis not present

## 2023-08-05 DIAGNOSIS — I781 Nevus, non-neoplastic: Secondary | ICD-10-CM | POA: Diagnosis not present

## 2023-08-11 ENCOUNTER — Encounter: Payer: Self-pay | Admitting: Physician Assistant

## 2023-08-11 ENCOUNTER — Ambulatory Visit: Payer: BC Managed Care – PPO | Admitting: Physician Assistant

## 2023-08-11 DIAGNOSIS — F4010 Social phobia, unspecified: Secondary | ICD-10-CM

## 2023-08-11 DIAGNOSIS — F3342 Major depressive disorder, recurrent, in full remission: Secondary | ICD-10-CM

## 2023-08-11 DIAGNOSIS — F411 Generalized anxiety disorder: Secondary | ICD-10-CM

## 2023-08-11 MED ORDER — BUSPIRONE HCL 15 MG PO TABS: ORAL_TABLET | ORAL | 1 refills | Status: DC

## 2023-08-11 MED ORDER — ESCITALOPRAM OXALATE 10 MG PO TABS: 10.0000 mg | ORAL_TABLET | Freq: Every day | ORAL | 1 refills | Status: DC

## 2023-08-11 MED ORDER — ALPRAZOLAM 0.25 MG PO TABS: 0.1250 mg | ORAL_TABLET | Freq: Two times a day (BID) | ORAL | 5 refills | Status: DC | PRN

## 2023-08-11 NOTE — Progress Notes (Signed)
 [facility]  Patient ID: Stacy Peck,  MRN: [id number]  PCP: Janace Aris, FNP  Date of Evaluation: 08/11/2023 time spent:20 minutes  Chief Complaint:  Chief Complaint   Anxiety; Depression; Follow-up    HISTORY/CURRENT STATUS: HPI For routine med check.   [name] is doing well.  She feels like her medications are still effective.  She is not having panic attacks like she has in the past but still has a lot of generalized anxiety.  She also gets anxious in social situations but is overall better than she used to be.  She does not take the Xanax often but it is effective when needed.  The BuSpar and Lexapro helped to prevent its need as much.     Patient is able to enjoy things.  Energy and motivation are good.  She still enjoys working out on a daily basis.  No extreme sadness, tearfulness, or feelings of hopelessness.  Sleeps well most of the time. ADLs and personal hygiene are normal.   Denies any changes in concentration, making decisions, or remembering things.  Appetite has not changed.  Weight is stable.  Denies suicidal or homicidal thoughts.  Patient denies increased energy with decreased need for sleep, increased talkativeness, racing thoughts, impulsivity or risky behaviors, increased spending, increased libido, grandiosity, increased irritability or anger, paranoia, or hallucinations.  Denies dizziness, syncope, seizures, numbness, tingling, tremor, tics, unsteady gait, slurred speech, confusion. Denies muscle or joint pain, stiffness, or dystonia.  Individual Medical History/ Review of Systems: Changes? :No      Past medications for mental health diagnoses include: Zoloft caused nausea, Prozac caused acne questionably, BuSpar, Luvox caused insomnia  Allergies: Patient has no known allergies.  Current Medications:  Current Outpatient Medications:    levothyroxine (SYNTHROID, LEVOTHROID) 75 MCG tablet, Take 75 mcg by mouth daily., Disp: , Rfl:    Multiple Vitamin  (MULTIVITAMIN) tablet, Take 1 tablet by mouth daily., Disp: , Rfl:    spironolactone (ALDACTONE) 100 MG tablet, Take 100 mg by mouth daily., Disp: , Rfl:    TRI-PREVIFEM 0.18/0.215/0.25 MG-35 MCG tablet, Take 1 tablet by mouth daily., Disp: , Rfl:    ALPRAZolam (XANAX) 0.25 MG tablet, Take 0.5-1 tablets (0.125-0.25 mg total) by mouth 2 (two) times daily as needed for anxiety., Disp: 60 tablet, Rfl: 5   busPIRone (BUSPAR) 15 MG tablet, 1/3 am and 1.5 TABS AT BEDTIME, Disp: 225 tablet, Rfl: 1   escitalopram (LEXAPRO) 10 MG tablet, Take 1 tablet (10 mg total) by mouth daily., Disp: 90 tablet, Rfl: 1 Medication Side Effects: none  Family Medical/ Social History: Changes? No   MENTAL HEALTH EXAM:  unknown if currently breastfeeding.There is no height or weight on file to calculate BMI.  General Appearance: Casual and Well Groomed  Eye Contact:  Good  Speech:  Clear and Coherent and Normal Rate  Volume:  Normal  Mood:  Euthymic  Affect:  Congruent  Thought Process:  Goal Directed and Descriptions of Associations: Circumstantial  Orientation:  Full (Time, Place, and Person)  Thought Content: Logical   Suicidal Thoughts:  No  Homicidal Thoughts:  No  Memory:  WNL  Judgement:  Good  Insight:  Good  Psychomotor Activity:  Normal  Concentration:  Concentration: Good and Attention Span: Good  Recall:  Good  Fund of Knowledge: Good  Language: Good  Assets:  Communication Skills Desire for Improvement Financial Resources/Insurance Housing Physical Health Resilience Transportation  ADL's:  Intact  Cognition: WNL  Prognosis:  Good  DIAGNOSES:    ICD-10-CM   1. Generalized anxiety disorder  F41.1     2. Social anxiety disorder  F40.10     3. Recurrent major depressive disorder, in full remission ([facility])  F33.42       Receiving Psychotherapy: No   RECOMMENDATIONS:  PDMP reviewed.  Last Xanax was filled 02/11/2023. I provided 20 minutes of face to face time during this encounter,  including time spent before and after the visit in records review, medical decision making, counseling pertinent to today's visit, and charting.   [name] is still responding to the current treatment so no changes will be made.  Change Xanax to 0.25 mg, she can take 1/2-1 twice a day as needed. Cont Buspar 15 mg, 1/3 po q am,  and 1.5 pills in evening.  Continue Lexapro 10 mg, 1 p.o. daily. Return in 6 months.  [name], PA-C

## 2023-10-13 DIAGNOSIS — B078 Other viral warts: Secondary | ICD-10-CM | POA: Diagnosis not present

## 2023-11-17 DIAGNOSIS — B078 Other viral warts: Secondary | ICD-10-CM | POA: Diagnosis not present

## 2023-12-06 DIAGNOSIS — J301 Allergic rhinitis due to pollen: Secondary | ICD-10-CM | POA: Diagnosis not present

## 2023-12-06 DIAGNOSIS — J3081 Allergic rhinitis due to animal (cat) (dog) hair and dander: Secondary | ICD-10-CM | POA: Diagnosis not present

## 2023-12-06 DIAGNOSIS — J3089 Other allergic rhinitis: Secondary | ICD-10-CM | POA: Diagnosis not present

## 2023-12-06 DIAGNOSIS — H1045 Other chronic allergic conjunctivitis: Secondary | ICD-10-CM | POA: Diagnosis not present

## 2023-12-23 DIAGNOSIS — B078 Other viral warts: Secondary | ICD-10-CM | POA: Diagnosis not present

## 2023-12-28 DIAGNOSIS — L578 Other skin changes due to chronic exposure to nonionizing radiation: Secondary | ICD-10-CM | POA: Diagnosis not present

## 2023-12-28 DIAGNOSIS — L700 Acne vulgaris: Secondary | ICD-10-CM | POA: Diagnosis not present

## 2023-12-28 DIAGNOSIS — D2230 Melanocytic nevi of unspecified part of face: Secondary | ICD-10-CM | POA: Diagnosis not present

## 2023-12-28 DIAGNOSIS — L821 Other seborrheic keratosis: Secondary | ICD-10-CM | POA: Diagnosis not present

## 2024-01-27 DIAGNOSIS — B078 Other viral warts: Secondary | ICD-10-CM | POA: Diagnosis not present

## 2024-02-13 ENCOUNTER — Encounter: Payer: Self-pay | Admitting: Physician Assistant

## 2024-02-13 ENCOUNTER — Ambulatory Visit: Admitting: Physician Assistant

## 2024-02-13 DIAGNOSIS — F4010 Social phobia, unspecified: Secondary | ICD-10-CM

## 2024-02-13 DIAGNOSIS — F411 Generalized anxiety disorder: Secondary | ICD-10-CM | POA: Diagnosis not present

## 2024-02-13 DIAGNOSIS — F3342 Major depressive disorder, recurrent, in full remission: Secondary | ICD-10-CM

## 2024-02-13 MED ORDER — ESCITALOPRAM OXALATE 10 MG PO TABS: 10.0000 mg | ORAL_TABLET | Freq: Every day | ORAL | 1 refills | Status: AC

## 2024-02-13 MED ORDER — BUSPIRONE HCL 15 MG PO TABS: ORAL_TABLET | ORAL | 1 refills | Status: DC

## 2024-02-13 MED ORDER — ALPRAZOLAM 0.25 MG PO TABS: 0.1250 mg | ORAL_TABLET | Freq: Two times a day (BID) | ORAL | 5 refills | Status: AC | PRN

## 2024-02-13 NOTE — Progress Notes (Signed)
 [facility]  Patient ID: Stacy Peck,  MRN: [id number]  PCP: Adah Wilbert LABOR, FNP  Date of Evaluation: 02/13/2024 time spent:20 minutes  Chief Complaint:  Chief Complaint   Anxiety; Follow-up    HISTORY/CURRENT STATUS: HPI For routine med check.   Stacy Peck is doing well.  Feels like the meds are still working as intended. Is very busy with her family, son is playing hockey this year.  Traveling a lot. She had a lot of fm visiting over the summer.   She still works out a lot.  Energy and motivation are good.   No extreme sadness, tearfulness, or feelings of hopelessness.  Sleeps well most of the time. ADLs and personal hygiene are normal.   Denies any changes in concentration, making decisions, or remembering things.  Appetite has not changed.  Weight is stable.   Anxiety is controlled.  Needs the Xanax  occas.  No mania, delirium, AH/VH.  No SI/HI.  Individual Medical History/ Review of Systems: Changes? :No      Past medications for mental health diagnoses include: Zoloft caused nausea, Prozac caused acne questionably, BuSpar , Luvox caused insomnia  Allergies: Patient has no known allergies.  Current Medications:  Current Outpatient Medications:    levothyroxine  (SYNTHROID , LEVOTHROID) 75 MCG tablet, Take 75 mcg by mouth daily., Disp: , Rfl:    Multiple Vitamin (MULTIVITAMIN) tablet, Take 1 tablet by mouth daily., Disp: , Rfl:    spironolactone (ALDACTONE) 100 MG tablet, Take 100 mg by mouth daily., Disp: , Rfl:    ALPRAZolam  (XANAX ) 0.25 MG tablet, Take 0.5-1 tablets (0.125-0.25 mg total) by mouth 2 (two) times daily as needed for anxiety., Disp: 60 tablet, Rfl: 5   busPIRone  (BUSPAR ) 15 MG tablet, 1/3 am and 1.5 TABS AT BEDTIME, Disp: 225 tablet, Rfl: 1   escitalopram  (LEXAPRO ) 10 MG tablet, Take 1 tablet (10 mg total) by mouth daily., Disp: 90 tablet, Rfl: 1   TRI-PREVIFEM 0.18/0.215/0.25 MG-35 MCG tablet, Take 1 tablet by mouth daily. (Patient not taking: Reported on  02/13/2024), Disp: , Rfl:  Medication Side Effects: none  Family Medical/ Social History: Changes? No   MENTAL HEALTH EXAM:  unknown if currently breastfeeding.There is no height or weight on file to calculate BMI.  General Appearance: Casual and Well Groomed  Eye Contact:  Good  Speech:  Clear and Coherent and Normal Rate  Volume:  Normal  Mood:  Euthymic  Affect:  Congruent  Thought Process:  Goal Directed and Descriptions of Associations: Circumstantial  Orientation:  Full (Time, Place, and Person)  Thought Content: Logical   Suicidal Thoughts:  No  Homicidal Thoughts:  No  Memory:  WNL  Judgement:  Good  Insight:  Good  Psychomotor Activity:  Normal  Concentration:  Concentration: Good and Attention Span: Good  Recall:  Good  Fund of Knowledge: Good  Language: Good  Assets:  Communication Skills Desire for Improvement Financial Resources/Insurance Housing Physical Health Resilience Social Support Transportation  ADL's:  Intact  Cognition: WNL  Prognosis:  Good   DIAGNOSES:    ICD-10-CM   1. Generalized anxiety disorder  F41.1     2. Social anxiety disorder  F40.10     3. Recurrent major depressive disorder, in full remission (HCC)  F33.42      Receiving Psychotherapy: No   RECOMMENDATIONS:  PDMP reviewed.  Last Xanax  was filled 08/11/2023. I provided approximately  20  minutes of face to face time during this encounter, including time spent before and after the visit  in records review, medical decision making, counseling pertinent to today's visit, and charting.   She is doing well on current treatment so no changes need to be made.  Change Xanax  to 0.25 mg, she can take 1/2-1 twice a day as needed. Cont Buspar  15 mg, 1/3 po q am,  and 1.5 pills in evening.  Continue Lexapro  10 mg, 1 p.o. daily. Return in 6 months.  Verneita Cooks, PA-C

## 2024-03-01 DIAGNOSIS — Z681 Body mass index (BMI) 19 or less, adult: Secondary | ICD-10-CM | POA: Diagnosis not present

## 2024-03-01 DIAGNOSIS — Z01419 Encounter for gynecological examination (general) (routine) without abnormal findings: Secondary | ICD-10-CM | POA: Diagnosis not present

## 2024-03-01 DIAGNOSIS — Z1231 Encounter for screening mammogram for malignant neoplasm of breast: Secondary | ICD-10-CM | POA: Diagnosis not present

## 2024-03-02 DIAGNOSIS — F418 Other specified anxiety disorders: Secondary | ICD-10-CM | POA: Diagnosis not present

## 2024-03-02 DIAGNOSIS — Z131 Encounter for screening for diabetes mellitus: Secondary | ICD-10-CM | POA: Diagnosis not present

## 2024-03-02 DIAGNOSIS — E039 Hypothyroidism, unspecified: Secondary | ICD-10-CM | POA: Diagnosis not present

## 2024-03-02 DIAGNOSIS — Z13220 Encounter for screening for lipoid disorders: Secondary | ICD-10-CM | POA: Diagnosis not present

## 2024-03-02 DIAGNOSIS — Z0000 Encounter for general adult medical examination without abnormal findings: Secondary | ICD-10-CM | POA: Diagnosis not present

## 2024-03-05 DIAGNOSIS — B078 Other viral warts: Secondary | ICD-10-CM | POA: Diagnosis not present

## 2024-04-09 DIAGNOSIS — B078 Other viral warts: Secondary | ICD-10-CM | POA: Diagnosis not present

## 2024-05-15 DIAGNOSIS — E039 Hypothyroidism, unspecified: Secondary | ICD-10-CM | POA: Diagnosis not present

## 2024-05-18 ENCOUNTER — Other Ambulatory Visit: Payer: Self-pay | Admitting: Physician Assistant

## 2024-08-16 ENCOUNTER — Ambulatory Visit: Admitting: Physician Assistant
